# Patient Record
Sex: Male | Born: 1950 | Race: Black or African American | Hispanic: No | Marital: Married | Smoking: Current every day smoker
Health system: Southern US, Community
[De-identification: ages and names within clinical notes are randomized; demographics above are authoritative.]

## PROBLEM LIST (undated history)

## (undated) DIAGNOSIS — M1710 Unilateral primary osteoarthritis, unspecified knee: Secondary | ICD-10-CM

## (undated) DIAGNOSIS — M179 Osteoarthritis of knee, unspecified: Secondary | ICD-10-CM

## (undated) DIAGNOSIS — I10 Essential (primary) hypertension: Secondary | ICD-10-CM

## (undated) DIAGNOSIS — S46009A Unspecified injury of muscle(s) and tendon(s) of the rotator cuff of unspecified shoulder, initial encounter: Secondary | ICD-10-CM

## (undated) DIAGNOSIS — C801 Malignant (primary) neoplasm, unspecified: Secondary | ICD-10-CM

## (undated) DIAGNOSIS — B1920 Unspecified viral hepatitis C without hepatic coma: Secondary | ICD-10-CM

## (undated) HISTORY — PX: SCAPHOID FRACTURE SURGERY: SHX769

## (undated) HISTORY — PX: APPENDECTOMY: SHX54

---

## 1998-02-10 ENCOUNTER — Encounter: Payer: Self-pay | Admitting: Emergency Medicine

## 1998-02-10 ENCOUNTER — Emergency Department (HOSPITAL_COMMUNITY): Admission: EM | Admit: 1998-02-10 | Discharge: 1998-02-10 | Payer: Self-pay | Admitting: Emergency Medicine

## 1999-12-12 ENCOUNTER — Encounter: Payer: Self-pay | Admitting: *Deleted

## 1999-12-12 ENCOUNTER — Emergency Department (HOSPITAL_COMMUNITY): Admission: EM | Admit: 1999-12-12 | Discharge: 1999-12-12 | Payer: Self-pay | Admitting: *Deleted

## 2001-11-15 ENCOUNTER — Emergency Department (HOSPITAL_COMMUNITY): Admission: EM | Admit: 2001-11-15 | Discharge: 2001-11-15 | Payer: Self-pay | Admitting: Emergency Medicine

## 2001-11-15 ENCOUNTER — Encounter: Payer: Self-pay | Admitting: Emergency Medicine

## 2001-11-20 ENCOUNTER — Encounter: Admission: RE | Admit: 2001-11-20 | Discharge: 2001-11-20 | Payer: Self-pay | Admitting: Internal Medicine

## 2001-12-12 ENCOUNTER — Emergency Department (HOSPITAL_COMMUNITY): Admission: EM | Admit: 2001-12-12 | Discharge: 2001-12-12 | Payer: Self-pay | Admitting: Emergency Medicine

## 2003-01-06 ENCOUNTER — Encounter: Payer: Self-pay | Admitting: General Practice

## 2003-01-06 ENCOUNTER — Encounter: Admission: RE | Admit: 2003-01-06 | Discharge: 2003-01-06 | Payer: Self-pay | Admitting: General Practice

## 2003-01-06 ENCOUNTER — Inpatient Hospital Stay (HOSPITAL_COMMUNITY): Admission: EM | Admit: 2003-01-06 | Discharge: 2003-01-08 | Payer: Self-pay | Admitting: Emergency Medicine

## 2003-01-16 ENCOUNTER — Emergency Department (HOSPITAL_COMMUNITY): Admission: EM | Admit: 2003-01-16 | Discharge: 2003-01-16 | Payer: Self-pay | Admitting: Emergency Medicine

## 2003-01-16 ENCOUNTER — Encounter: Payer: Self-pay | Admitting: Emergency Medicine

## 2004-08-21 ENCOUNTER — Emergency Department (HOSPITAL_COMMUNITY): Admission: EM | Admit: 2004-08-21 | Discharge: 2004-08-21 | Payer: Self-pay | Admitting: Emergency Medicine

## 2004-11-16 ENCOUNTER — Encounter: Admission: RE | Admit: 2004-11-16 | Discharge: 2004-11-16 | Payer: Self-pay | Admitting: Otolaryngology

## 2004-11-20 ENCOUNTER — Ambulatory Visit (HOSPITAL_COMMUNITY): Admission: RE | Admit: 2004-11-20 | Discharge: 2004-11-20 | Payer: Self-pay | Admitting: Otolaryngology

## 2004-11-20 ENCOUNTER — Encounter (INDEPENDENT_AMBULATORY_CARE_PROVIDER_SITE_OTHER): Payer: Self-pay | Admitting: *Deleted

## 2004-11-20 ENCOUNTER — Ambulatory Visit (HOSPITAL_BASED_OUTPATIENT_CLINIC_OR_DEPARTMENT_OTHER): Admission: RE | Admit: 2004-11-20 | Discharge: 2004-11-20 | Payer: Self-pay | Admitting: Otolaryngology

## 2006-01-02 ENCOUNTER — Emergency Department (HOSPITAL_COMMUNITY): Admission: EM | Admit: 2006-01-02 | Discharge: 2006-01-02 | Payer: Self-pay | Admitting: Emergency Medicine

## 2007-08-02 ENCOUNTER — Emergency Department (HOSPITAL_COMMUNITY): Admission: EM | Admit: 2007-08-02 | Discharge: 2007-08-02 | Payer: Self-pay | Admitting: Family Medicine

## 2007-11-24 ENCOUNTER — Encounter: Admission: RE | Admit: 2007-11-24 | Discharge: 2008-01-07 | Payer: Self-pay | Admitting: Orthopedic Surgery

## 2009-01-24 ENCOUNTER — Emergency Department (HOSPITAL_COMMUNITY): Admission: EM | Admit: 2009-01-24 | Discharge: 2009-01-24 | Payer: Self-pay | Admitting: Emergency Medicine

## 2010-04-29 ENCOUNTER — Emergency Department (HOSPITAL_COMMUNITY)
Admission: EM | Admit: 2010-04-29 | Discharge: 2010-04-29 | Payer: Self-pay | Source: Home / Self Care | Admitting: Emergency Medicine

## 2010-08-10 ENCOUNTER — Emergency Department (HOSPITAL_COMMUNITY): Payer: Non-veteran care

## 2010-08-10 ENCOUNTER — Inpatient Hospital Stay (HOSPITAL_COMMUNITY)
Admission: EM | Admit: 2010-08-10 | Discharge: 2010-08-14 | DRG: 264 | Disposition: A | Discharge status: Home / Self Care | Payer: Non-veteran care | Attending: Internal Medicine | Admitting: Internal Medicine

## 2010-08-10 ENCOUNTER — Emergency Department (HOSPITAL_COMMUNITY)
Admission: EM | Admit: 2010-08-10 | Discharge: 2010-08-10 | Disposition: A | Discharge status: Home / Self Care | Payer: PRIVATE HEALTH INSURANCE | Attending: Emergency Medicine | Admitting: Emergency Medicine

## 2010-08-10 ENCOUNTER — Emergency Department (HOSPITAL_COMMUNITY): Payer: PRIVATE HEALTH INSURANCE

## 2010-08-10 DIAGNOSIS — W19XXXA Unspecified fall, initial encounter: Secondary | ICD-10-CM | POA: Insufficient documentation

## 2010-08-10 DIAGNOSIS — R55 Syncope and collapse: Principal | ICD-10-CM | POA: Diagnosis present

## 2010-08-10 DIAGNOSIS — Z8619 Personal history of other infectious and parasitic diseases: Secondary | ICD-10-CM | POA: Insufficient documentation

## 2010-08-10 DIAGNOSIS — Z79899 Other long term (current) drug therapy: Secondary | ICD-10-CM | POA: Insufficient documentation

## 2010-08-10 DIAGNOSIS — M19239 Secondary osteoarthritis, unspecified wrist: Secondary | ICD-10-CM | POA: Diagnosis present

## 2010-08-10 DIAGNOSIS — E876 Hypokalemia: Secondary | ICD-10-CM | POA: Diagnosis present

## 2010-08-10 DIAGNOSIS — I5032 Chronic diastolic (congestive) heart failure: Secondary | ICD-10-CM | POA: Diagnosis present

## 2010-08-10 DIAGNOSIS — S6990XA Unspecified injury of unspecified wrist, hand and finger(s), initial encounter: Secondary | ICD-10-CM | POA: Insufficient documentation

## 2010-08-10 DIAGNOSIS — B1920 Unspecified viral hepatitis C without hepatic coma: Secondary | ICD-10-CM | POA: Diagnosis present

## 2010-08-10 DIAGNOSIS — S59909A Unspecified injury of unspecified elbow, initial encounter: Secondary | ICD-10-CM | POA: Insufficient documentation

## 2010-08-10 DIAGNOSIS — I10 Essential (primary) hypertension: Secondary | ICD-10-CM | POA: Diagnosis present

## 2010-08-10 DIAGNOSIS — Y9289 Other specified places as the place of occurrence of the external cause: Secondary | ICD-10-CM

## 2010-08-10 DIAGNOSIS — IMO0002 Reserved for concepts with insufficient information to code with codable children: Secondary | ICD-10-CM | POA: Diagnosis present

## 2010-08-10 DIAGNOSIS — S63509A Unspecified sprain of unspecified wrist, initial encounter: Secondary | ICD-10-CM | POA: Insufficient documentation

## 2010-08-10 DIAGNOSIS — S61209A Unspecified open wound of unspecified finger without damage to nail, initial encounter: Secondary | ICD-10-CM | POA: Diagnosis present

## 2010-08-10 DIAGNOSIS — T40605A Adverse effect of unspecified narcotics, initial encounter: Secondary | ICD-10-CM | POA: Diagnosis present

## 2010-08-10 DIAGNOSIS — S02401A Maxillary fracture, unspecified, initial encounter for closed fracture: Secondary | ICD-10-CM | POA: Diagnosis present

## 2010-08-10 DIAGNOSIS — F17200 Nicotine dependence, unspecified, uncomplicated: Secondary | ICD-10-CM | POA: Diagnosis present

## 2010-08-10 DIAGNOSIS — Y92009 Unspecified place in unspecified non-institutional (private) residence as the place of occurrence of the external cause: Secondary | ICD-10-CM | POA: Insufficient documentation

## 2010-08-10 LAB — URINALYSIS, ROUTINE W REFLEX MICROSCOPIC
Bilirubin Urine: NEGATIVE
Glucose, UA: NEGATIVE mg/dL
Hgb urine dipstick: NEGATIVE
Ketones, ur: NEGATIVE mg/dL
Nitrite: NEGATIVE
Specific Gravity, Urine: 1.017 (ref 1.005–1.030)
pH: 6.5 (ref 5.0–8.0)

## 2010-08-10 LAB — POCT CARDIAC MARKERS
CKMB, poc: <1 ng/mL — ABNORMAL LOW (ref 1.0–8.0)
Myoglobin, poc: 112 ng/mL (ref 12–200)
Troponin i, poc: <0.05 ng/mL (ref 0.00–0.09)

## 2010-08-10 LAB — BASIC METABOLIC PANEL
BUN: 9 mg/dL (ref 6–23)
CO2: 26 mEq/L (ref 19–32)
GFR calc non Af Amer: >60 mL/min (ref >60)
Glucose, Bld: 116 mg/dL — ABNORMAL HIGH (ref 70–99)
Potassium: 3.4 mEq/L — ABNORMAL LOW (ref 3.5–5.1)
Sodium: 137 mEq/L (ref 135–145)

## 2010-08-10 LAB — RAPID URINE DRUG SCREEN, HOSP PERFORMED
Barbiturates: NOT DETECTED
Cocaine: NOT DETECTED
Opiates: NOT DETECTED
Tetrahydrocannabinol: NOT DETECTED

## 2010-08-10 LAB — CBC
HCT: 32.7 % — ABNORMAL LOW (ref 39.0–52.0)
Hemoglobin: 10.5 g/dL — ABNORMAL LOW (ref 13.0–17.0)
MCV: 93.7 fL (ref 78.0–100.0)
RDW: 16.9 % — ABNORMAL HIGH (ref 11.5–15.5)
WBC: 3.3 10*3/uL — ABNORMAL LOW (ref 4.0–10.5)

## 2010-08-10 LAB — DIFFERENTIAL
Eosinophils Relative: 1 % (ref 0–5)
Lymphocytes Relative: 41 % (ref 12–46)
Lymphs Abs: 1.4 10*3/uL (ref 0.7–4.0)
Monocytes Absolute: 0.4 10*3/uL (ref 0.1–1.0)
Neutro Abs: 1.5 10*3/uL — ABNORMAL LOW (ref 1.7–7.7)

## 2010-08-10 LAB — GLUCOSE, CAPILLARY

## 2010-08-11 ENCOUNTER — Inpatient Hospital Stay (HOSPITAL_COMMUNITY): Payer: Non-veteran care

## 2010-08-11 LAB — COMPREHENSIVE METABOLIC PANEL
ALT: 19 U/L (ref 0–53)
Alkaline Phosphatase: 58 U/L (ref 39–117)
BUN: 9 mg/dL (ref 6–23)
CO2: 26 mEq/L (ref 19–32)
Calcium: 8.7 mg/dL (ref 8.4–10.5)
GFR calc non Af Amer: >60 mL/min (ref >60)
Glucose, Bld: 96 mg/dL (ref 70–99)
Sodium: 139 mEq/L (ref 135–145)
Total Protein: 6.8 g/dL (ref 6.0–8.3)

## 2010-08-11 LAB — CBC
HCT: 29.5 % — ABNORMAL LOW (ref 39.0–52.0)
Hemoglobin: 9.5 g/dL — ABNORMAL LOW (ref 13.0–17.0)
MCH: 29.9 pg (ref 26.0–34.0)
MCHC: 32.2 g/dL (ref 30.0–36.0)
MCV: 92.8 fL (ref 78.0–100.0)
RDW: 16.4 % — ABNORMAL HIGH (ref 11.5–15.5)

## 2010-08-11 LAB — GLUCOSE, CAPILLARY: Glucose-Capillary: 101 mg/dL — ABNORMAL HIGH (ref 70–99)

## 2010-08-11 LAB — FOLATE: Folate: 11.6 ng/mL

## 2010-08-11 LAB — MAGNESIUM: Magnesium: 1.9 mg/dL (ref 1.5–2.5)

## 2010-08-11 LAB — FERRITIN: Ferritin: 708 ng/mL — ABNORMAL HIGH (ref 22–322)

## 2010-08-11 LAB — IRON AND TIBC: UIBC: 256 ug/dL

## 2010-08-11 LAB — TSH: TSH: 1.827 u[IU]/mL (ref 0.350–4.500)

## 2010-08-12 LAB — BASIC METABOLIC PANEL
Chloride: 104 mEq/L (ref 96–112)
Creatinine, Ser: 1.02 mg/dL (ref 0.4–1.5)
GFR calc Af Amer: >60 mL/min (ref >60)
Potassium: 3.5 mEq/L (ref 3.5–5.1)

## 2010-08-12 LAB — CARDIAC PANEL(CRET KIN+CKTOT+MB+TROPI)
Relative Index: 1.3 (ref 0.0–2.5)
Total CK: 223 U/L (ref 7–232)
Troponin I: 0.03 ng/mL (ref 0.00–0.06)
Troponin I: 0.03 ng/mL (ref 0.00–0.06)

## 2010-08-12 LAB — HEMOCCULT GUIAC POC 1CARD (OFFICE): Fecal Occult Bld: NEGATIVE

## 2010-08-12 NOTE — Consult Note (Signed)
NAME:  Antonio Nicholson, Antonio Nicholson NO.:  [id number]  MEDICAL RECORD NO.:  [id number]           PATIENT TYPE:  I  LOCATION:  3702                         FACILITY:  [facility]  PHYSICIAN:  Dionne Ano. Kahlen Morais, M.D.DATE OF BIRTH:  12-13-1950  DATE OF CONSULTATION:  08/10/2010 DATE OF DISCHARGE:                                CONSULTATION   I had the pleasure to see Antonio Nicholson in the [facility].  He was admitted today after a fall.  He fell and sustained facial trauma.  In the course of his workup, it was noted that the patient had significant pain in his left wrist.  He states his left wrist has been painful for quite some time.  He has had a longstanding history of problems.  He has had surgery by Dr. Teressa Senter for middle finger injuries and wrist injury.  The patient notes that he has had scaphoid excision and attempted four-corner fusion it appears.  The wrist is tender and painful.  He notes no locking, popping, catching.  Denies elbow pain.  Right upper extremity is notable for an IV access but otherwise is nontender.  Pain was not moderate in the wrist.  He had facial trauma and surgery is being planned for the facial trauma it appears in the future.  I have been asked to see him by his hospitalist in regard to the upper extremity predicament.  Past medical and surgical history are reviewed.  The patient and I have discussed all issues in regard to his chief complaints and reason for admission.  His daughter is here with him as his is granddaughter who both correlate and discussed his exam and history.  His examination shows a black male, alert and oriented, in no distress. Vital signs stable.  The patient has normal lower extremity exam.  Face is deferred to the facial specialist.  His chest is clear.  Abdomen is mildly protuberant, nontender, nondistended and soft.  Right upper extremity has IV access, nontender, nondistended.  Neurovascularly intact.   Left upper extremity has swelling and mild synovitis as sequelae of severe arthritis in the wrist.  His middle finger has loss of motion at the DIP joint.  There is no evidence of infection or dystrophy.  Thumb and ring finger have lacerations about them which I have reviewed.  These lacerations are full-thickness skin lacerations and there is devitalized tissue in the region.  I have reviewed this at length and his findings.  His x-rays are reviewed.  Hand x-rays show degenerative arthritis.  He has sequela of scaphoid excision and four-corner fusion attempts.  It appears the four-corner fusion has not gone on to completely quiescent osseous integration and that he has severe degenerative changes throughout the wrist.  Middle finger also shows degenerative changes about the DIP joint.  I have reviewed this at length and his findings. Ring finger and thumb are without obvious acute trauma or dislocation features.  IMPRESSION: 1. 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 Teressa Senter at     the [facility]. 2.  Recent injury to the thumb and ring finger with skin avulsion and     defect. 3. History of middle finger reconstruction with degenerative     arthritis. 4. No evidence of compartment syndrome, infection, dystrophy, or acute     fracture.  This is primarily a sequelae of arthritis.  PLAN:  I have gone ahead and consented him verbally for I and D.  He underwent I and D of skin and subcutaneous tissue about thumb and skin and subcutaneous tissue about the ring finger with removal (excisional debridement) of the devitalized tissue.  I used knife, blade, scissor, and orthopedic instruments for this.  Following this I dressed him sterilely.  I feel the nursing staff can dress him with Neosporin, nonstick gauze, and daily dressings.  I have discussed this with he and his family at length and they agree.   I have discussed with him that in regard to the wrist arthritis, I would recommend referral back to his original surgeon Dr. Teressa Senter to discuss further reconstructive options if he desires.  Otherwise, he simply has arthritis and an acute exacerbation.  I put this in the chart.  I will plan to see him back if his symptoms dictate.  These notes discussed all questions have been encouraged and answered.  It was pleasure to see him tonight.     Dionne Ano. Amanda Pea, M.D.     [facility]  D:  08/10/2010  T:  08/11/2010  Job:  [id number]  Electronically Signed by Dominica Severin M.D. on 08/12/2010 07:52:35 AM

## 2010-08-13 ENCOUNTER — Inpatient Hospital Stay (HOSPITAL_COMMUNITY): Payer: Non-veteran care

## 2010-08-13 LAB — BASIC METABOLIC PANEL
BUN: 6 mg/dL (ref 6–23)
Chloride: 106 mEq/L (ref 96–112)
Creatinine, Ser: 0.91 mg/dL (ref 0.4–1.5)
Glucose, Bld: 84 mg/dL (ref 70–99)

## 2010-08-13 NOTE — Op Note (Signed)
NAME:  Antonio Nicholson, Antonio Nicholson NO.:  [id number]  MEDICAL RECORD NO.:  [id number]           PATIENT TYPE:  I  LOCATION:  3702                         FACILITY:  [facility]  PHYSICIAN:  Lincoln Brigham, DDSDATE OF BIRTH:  July 19, 1950  DATE OF PROCEDURE:  08/11/2010 DATE OF DISCHARGE:                              OPERATIVE REPORT   PREOPERATIVE DIAGNOSIS:  Right zygomaticomaxillary complex fracture.  POSTOPERATIVE DIAGNOSIS:  Right zygomaticomaxillary complex fracture.  NAME OF OPERATION:  Open reduction and internal fixation of right zygomaticomaxillary complex fracture.  SURGEON:  Lincoln Brigham, DDS  INDICATIONS FOR SURGERY:  The patient is a 60 year old African American male status post ground-level fall on August 10, 2010, after being discharged from the [facility] ER for her previous ground-level fall that same day.  The patient was waiting at bus stop and sustained a syncopal event.  At that time, the patient lost consciousness and fell outside on the ground at the bus stop sustaining a right zygomaticomaxillary complex fracture, which was diagnosed on a maxillofacial head CT scan.  It was deemed necessary that the patient be taken to the operating room for repair of the depressed zygomaticomaxillary complex fracture.  PROCEDURE IN DETAIL:  The patient was seen and evaluated by Anesthesia. The patient was deemed appropriate for surgery.  The patient's history and physical and consent were verified and updated by myself.  All the patient's questions were answered.  The patient was taken to the operating room by Anesthesia and placed under general anesthesia.  The patient was positioned on the table in a supine position.  After the patient was intubated, he was turned 90 degrees from anesthesia.  The patient was prepped and draped in usual sterile fashion all maxillofacial surgery procedures.  Approximately 10 mL of local anesthetic which was 2%  lidocaine with 1:1 epinephrine was used to anesthetize the right lateral brow region and then intraorally and the right maxillary tuberosity region also anteriorly towards the piriform rim on the patient's right side.  At this point, a moistened Ray-Tec was then placed in the patient's mouth vessel as a throat pack.  At this point, a 15-blade was then used to make a skin incision just lateral to the patient's brow from the patient's right side.  This incision was carried down through skin and subcutaneous tissue.  Next, the Bovie cautery set at 30:30 was then used to carry the incision down to bone and next the periosteal elevator was then used to create a pocket and identify the fracture of the zygomaticofrontal suture.  After the fracture was identified, our attention was then turned intraorally where a Bovie cautery set on 30:30 was used to make a hemi Jerry Caras I incision extending from the maxillary buttress to the piriform rim on the right side.  This incision was carried down to bone and a periosteal elevator was then used to reflect tissue and identify the facial fractures.  At this point, a periosteal elevator was then to mobilize the fractured segment and reduce that segment.  Of note 2 small pieces of the patient's right maxillary anterior sinus were removed from the maxillary  sinus cavity.  These pieces were too small to plate in place as screws being placed in then would cause avascular necrosis of the bony segments.  These pieces were discarded.  After the fracture was properly reduced, a Stryker Leibinger 64-L plate was then positioned at the patient's right maxillary buttress and secured with six 5-mm screws. This plate was taken from the mid Administrator.  Next our attention was then turned to the patient's zygomaticofrontal suture where am upper face Stryker Leibinger set plate was then used to internally fixate the frontal zygomaticofrontal suture.  Two 4-mm  screws were then used along with two 5-mm screws to fixate a plate.  At this point, copious irrigation was then used to irrigate both wounds and then the lateral brow wound was closed with 4-0 Vicryl suture deep and then a running 5-0 Prolene was then used to close the skin superficially. Intraorally a 3-0 chromic gut suture was then used to close the incision.  The patient had good hemostasis at the conclusion of the case.  All counts were correct x2.  The patient's moistened Ray-Tec was removed and the patient's mouth was suctioned with Yankauer suction.  An OG tube was then passed with minimal output and an OG tube was then removed.  The patient was then turned over to the care of Anesthesia once again.  The patient was extubated and taken to the recovery area in a stable fashion.  COMPLICATIONS:  None.  ESTIMATED BLOOD LOSS:  Minimal.  FINDINGS:  Fracture of the right zygomaticomaxillary complex.  No specimens were sent to Pathology; however, there were two pieces of the right maxillary sinus, which were discarded, which could not be plated.          ______________________________ Lincoln Brigham, DDS     CD/MEDQ  D:  08/12/2010  T:  08/13/2010  Job:  [id number]  Electronically Signed by Lincoln Brigham DDS on 08/13/2010 07:43:29 AM

## 2010-08-14 LAB — RAPID URINE DRUG SCREEN, HOSP PERFORMED
Amphetamines: NOT DETECTED
Benzodiazepines: NOT DETECTED
Cocaine: NOT DETECTED
Tetrahydrocannabinol: NOT DETECTED

## 2010-08-14 LAB — HEMOCCULT GUIAC POC 1CARD (OFFICE): Fecal Occult Bld: POSITIVE

## 2010-08-14 LAB — CLOSTRIDIUM DIFFICILE BY PCR: Toxigenic C. Difficile by PCR: NEGATIVE

## 2010-08-14 NOTE — Procedures (Signed)
EEG NUMBER:  HISTORY:  A 60 year old male with syncope.  MEDICATIONS:  Boceprevir, ribavirin, potassium, Rocephin, oxycodone, Dilaudid.  CONDITIONS OF RECORDING:  This is a 16-channel EEG carried out with the patient in the awake, drowsy, and asleep states.  DESCRIPTION:  The waking background activity consists of a low-voltage symmetrical fairly well-organized 10 Hz alpha activity seen from the parieto-occipital and posterotemporal regions.  Low-voltage fast activity poorly organized are seen anteriorly at times superimposed on more posterior rhythms.  A mixture of theta and alpha rhythm was seen from the central and temporal regions.  The patient drowses with slowing to irregular low-voltage theta and beta activity.  The patient goes into a light sleep with symmetrical sleep spindles.  The vertex was a sharp activity and irregular slow activity.  Hypoventilation was not performed.  Intermittent photic stimulation failed to elicit any change in the tracing.  IMPRESSION:  This is a normal EEG.          ______________________________ Thana Farr, MD    JY:NWGN D:  08/13/2010 14:47:29  T:  08/14/2010 01:54:46  Job #:  [id number]
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
## 2010-08-31 NOTE — Op Note (Signed)
NAME:  DAKIN, MADANI NO.:  [id number]   MEDICAL RECORD NO.:  [id number]          PATIENT TYPE:  AMB   LOCATION:  DSC                          FACILITY:  [facility]   PHYSICIAN:  Christopher E. 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   OPERATION:  Microlaryngoscopy with biopsy of left and right anterior vocal  cord lesions.   SURGEON:  Narda Bonds, M.D.   ANESTHESIA:  General endotracheal.   COMPLICATIONS:  None.   BRIEF CLINICAL NOTE:  Antonio Nicholson is a 60 year old gentleman who has had  previous vocal cord lesions removed from his vocal cords 20 years ago.  Over  the last several months his voice has gradually gotten worse again, he does  smoke about a pack a day and moderate alcohol use.  Denies any sore throat.  On examination, he has what appears to be a large anterior nodule of the  left vocal cord.  He is taken to the operating room at this time for direct  laryngoscopy and biopsy.   DESCRIPTION OF PROCEDURE:  After adequate endotracheal anesthesia, a direct  laryngoscopy was performed.  The base of tongue, vallecula, epiglottis were  normal to evaluation.  Both piriform sinuses were clear.  On evaluation of  vocal cords, Jaxon had a nodular lesion on the left vocal cord and some  leukoplakic changes of the right vocal cord.  The laryngoscope was  suspended, pictures were obtained and biopsies were obtained of the nodule  on the left vocal cord and the leukoplakic area on the right vocal cord.  Anterior commissure appeared unaffected.  Hemostasis was obtained with  cotton pledget soaked in adrenaline.  That completed the procedure.  Darrold  was awoken from anesthesia and  transferred to the recovery room postop doing  well.   DISPOSITION:  Glennie Rodda is discharged home later this morning.  Will  have followup in my office in 1 week for recheck and review pathology.  I  gave him a prescription for Darvocet-N 100 1-2 q.4 hours p.r.n. pain along  with Tylenol.        CEN/MEDQ  D:  11/20/2004  T:  11/20/2004  Job:  [id number]

## 2011-01-08 LAB — COMPREHENSIVE METABOLIC PANEL
ALT: 55 — ABNORMAL HIGH
AST: 62 — ABNORMAL HIGH
Alkaline Phosphatase: 50
CO2: 19
GFR calc Af Amer: >60
GFR calc non Af Amer: >60
Glucose, Bld: 69 — ABNORMAL LOW
Potassium: 4.4
Sodium: 143
Total Protein: 6.9

## 2011-01-08 LAB — DIFFERENTIAL
Basophils Relative: 0
Eosinophils Absolute: 0
Eosinophils Relative: 0
Monocytes Relative: 9
Neutrophils Relative %: 70

## 2011-01-08 LAB — URINALYSIS, ROUTINE W REFLEX MICROSCOPIC
Bilirubin Urine: NEGATIVE
Ketones, ur: 15 — AB
Nitrite: NEGATIVE
Protein, ur: NEGATIVE
Specific Gravity, Urine: 1.017
Urobilinogen, UA: 0.2

## 2011-01-08 LAB — ETHANOL: Alcohol, Ethyl (B): 20 — ABNORMAL HIGH

## 2011-01-08 LAB — CBC
Hemoglobin: 13.3
RBC: 4.22

## 2011-01-08 LAB — RAPID URINE DRUG SCREEN, HOSP PERFORMED
Amphetamines: NOT DETECTED
Tetrahydrocannabinol: POSITIVE — AB

## 2011-01-15 ENCOUNTER — Inpatient Hospital Stay (HOSPITAL_COMMUNITY)
Admission: EM | Admit: 2011-01-15 | Discharge: 2011-01-19 | DRG: 554 | Disposition: A | Discharge status: Home / Self Care | Payer: Non-veteran care | Attending: Internal Medicine | Admitting: Internal Medicine

## 2011-01-15 DIAGNOSIS — Z531 Procedure and treatment not carried out because of patient's decision for reasons of belief and group pressure: Secondary | ICD-10-CM

## 2011-01-15 DIAGNOSIS — E876 Hypokalemia: Secondary | ICD-10-CM | POA: Diagnosis not present

## 2011-01-15 DIAGNOSIS — I10 Essential (primary) hypertension: Secondary | ICD-10-CM | POA: Diagnosis present

## 2011-01-15 DIAGNOSIS — F17200 Nicotine dependence, unspecified, uncomplicated: Secondary | ICD-10-CM | POA: Diagnosis present

## 2011-01-15 DIAGNOSIS — M2550 Pain in unspecified joint: Secondary | ICD-10-CM | POA: Diagnosis present

## 2011-01-15 DIAGNOSIS — B1920 Unspecified viral hepatitis C without hepatic coma: Secondary | ICD-10-CM | POA: Diagnosis present

## 2011-01-15 DIAGNOSIS — M109 Gout, unspecified: Principal | ICD-10-CM | POA: Diagnosis present

## 2011-01-15 LAB — HEPATIC FUNCTION PANEL
Albumin: 3.2 g/dL — ABNORMAL LOW (ref 3.5–5.2)
Alkaline Phosphatase: 76 U/L (ref 39–117)
Indirect Bilirubin: 0.1 mg/dL — ABNORMAL LOW (ref 0.3–0.9)
Total Bilirubin: 0.2 mg/dL — ABNORMAL LOW (ref 0.3–1.2)

## 2011-01-15 LAB — DIFFERENTIAL
Eosinophils Absolute: 0 10*3/uL (ref 0.0–0.7)
Eosinophils Relative: 0 % (ref 0–5)
Monocytes Absolute: 0.8 10*3/uL (ref 0.1–1.0)
Neutrophils Relative %: 66 % (ref 43–77)

## 2011-01-15 LAB — CBC
MCHC: 30.2 g/dL (ref 30.0–36.0)
RDW: 16.5 % — ABNORMAL HIGH (ref 11.5–15.5)

## 2011-01-15 LAB — BASIC METABOLIC PANEL
Calcium: 9.5 mg/dL (ref 8.4–10.5)
GFR calc Af Amer: >90 mL/min (ref >90)
GFR calc non Af Amer: >90 mL/min (ref >90)
Glucose, Bld: 87 mg/dL (ref 70–99)
Potassium: 2.9 mEq/L — ABNORMAL LOW (ref 3.5–5.1)
Sodium: 137 mEq/L (ref 135–145)

## 2011-01-15 LAB — SEDIMENTATION RATE: Sed Rate: 103 mm/hr — ABNORMAL HIGH (ref 0–16)

## 2011-01-16 ENCOUNTER — Inpatient Hospital Stay (HOSPITAL_COMMUNITY): Payer: Non-veteran care

## 2011-01-16 LAB — RENAL FUNCTION PANEL
Albumin: 2.7 g/dL — ABNORMAL LOW (ref 3.5–5.2)
Chloride: 103 mEq/L (ref 96–112)
Creatinine, Ser: 0.66 mg/dL (ref 0.50–1.35)
GFR calc non Af Amer: >90 mL/min (ref >90)
Potassium: 2.9 mEq/L — ABNORMAL LOW (ref 3.5–5.1)

## 2011-01-16 LAB — URIC ACID: Uric Acid, Serum: 6.7 mg/dL (ref 4.0–7.8)

## 2011-01-16 LAB — C4 COMPLEMENT: Complement C4, Body Fluid: 36 mg/dL (ref 10–40)

## 2011-01-16 LAB — CBC
Platelets: 165 10*3/uL (ref 150–400)
RDW: 16.6 % — ABNORMAL HIGH (ref 11.5–15.5)
WBC: 5.8 10*3/uL (ref 4.0–10.5)

## 2011-01-17 LAB — COMPREHENSIVE METABOLIC PANEL
ALT: 11 U/L (ref 0–53)
BUN: 6 mg/dL (ref 6–23)
Calcium: 8.6 mg/dL (ref 8.4–10.5)
Creatinine, Ser: 0.6 mg/dL (ref 0.50–1.35)
GFR calc Af Amer: >90 mL/min (ref >90)
Glucose, Bld: 116 mg/dL — ABNORMAL HIGH (ref 70–99)
Sodium: 137 mEq/L (ref 135–145)
Total Protein: 6.3 g/dL (ref 6.0–8.3)

## 2011-01-17 LAB — DIFFERENTIAL
Basophils Relative: 0 % (ref 0–1)
Monocytes Absolute: 1.2 10*3/uL — ABNORMAL HIGH (ref 0.1–1.0)
Monocytes Relative: 19 % — ABNORMAL HIGH (ref 3–12)
Neutro Abs: 3.7 10*3/uL (ref 1.7–7.7)

## 2011-01-17 LAB — SYNOVIAL CELL COUNT + DIFF, W/ CRYSTALS
Eosinophils-Synovial: 0 % (ref 0–1)
Other Cells-SYN: 0

## 2011-01-17 LAB — CBC
Hemoglobin: 7.7 g/dL — ABNORMAL LOW (ref 13.0–17.0)
MCH: 27.4 pg (ref 26.0–34.0)
MCHC: 30.3 g/dL (ref 30.0–36.0)
MCV: 90.4 fL (ref 78.0–100.0)

## 2011-01-17 LAB — CYCLIC CITRUL PEPTIDE ANTIBODY, IGG: Cyclic Citrullin Peptide Ab: <2 U/mL (ref 0.0–5.0)

## 2011-01-17 LAB — MAGNESIUM: Magnesium: 1.8 mg/dL (ref 1.5–2.5)

## 2011-01-17 LAB — ABO/RH: ABO/RH(D): O POS

## 2011-01-17 LAB — ANA: Anti Nuclear Antibody(ANA): NEGATIVE

## 2011-01-18 LAB — COMPREHENSIVE METABOLIC PANEL
AST: 29 U/L (ref 0–37)
Albumin: 2.5 g/dL — ABNORMAL LOW (ref 3.5–5.2)
Alkaline Phosphatase: 63 U/L (ref 39–117)
CO2: 23 mEq/L (ref 19–32)
Chloride: 104 mEq/L (ref 96–112)
Creatinine, Ser: 0.55 mg/dL (ref 0.50–1.35)
GFR calc non Af Amer: >90 mL/min (ref >90)
Potassium: 3.5 mEq/L (ref 3.5–5.1)
Total Bilirubin: 0.3 mg/dL (ref 0.3–1.2)

## 2011-01-18 LAB — DIFFERENTIAL
Basophils Absolute: 0 10*3/uL (ref 0.0–0.1)
Lymphocytes Relative: 22 % (ref 12–46)
Lymphs Abs: 1.3 10*3/uL (ref 0.7–4.0)
Neutrophils Relative %: 63 % (ref 43–77)

## 2011-01-18 LAB — CBC
HCT: 26 % — ABNORMAL LOW (ref 39.0–52.0)
MCV: 89 fL (ref 78.0–100.0)
Platelets: 158 10*3/uL (ref 150–400)
RBC: 2.92 MIL/uL — ABNORMAL LOW (ref 4.22–5.81)
WBC: 6.1 10*3/uL (ref 4.0–10.5)

## 2011-01-19 LAB — COMPREHENSIVE METABOLIC PANEL
ALT: 22 U/L (ref 0–53)
Alkaline Phosphatase: 65 U/L (ref 39–117)
Calcium: 9.2 mg/dL (ref 8.4–10.5)
Chloride: 104 mEq/L (ref 96–112)
Creatinine, Ser: 0.55 mg/dL (ref 0.50–1.35)
GFR calc Af Amer: >90 mL/min (ref >90)
GFR calc non Af Amer: >90 mL/min (ref >90)
Sodium: 138 mEq/L (ref 135–145)
Total Bilirubin: 0.2 mg/dL — ABNORMAL LOW (ref 0.3–1.2)
Total Protein: 6.7 g/dL (ref 6.0–8.3)

## 2011-01-19 LAB — CBC
MCH: 26.9 pg (ref 26.0–34.0)
MCV: 89.5 fL (ref 78.0–100.0)
Platelets: 183 10*3/uL (ref 150–400)
RDW: 16.8 % — ABNORMAL HIGH (ref 11.5–15.5)
WBC: 4.6 10*3/uL (ref 4.0–10.5)

## 2011-01-19 LAB — DIFFERENTIAL
Basophils Relative: 0 % (ref 0–1)
Eosinophils Absolute: 0.1 10*3/uL (ref 0.0–0.7)
Eosinophils Relative: 2 % (ref 0–5)
Lymphs Abs: 1.5 10*3/uL (ref 0.7–4.0)

## 2011-01-19 LAB — MAGNESIUM: Magnesium: 1.7 mg/dL (ref 1.5–2.5)

## 2011-01-19 NOTE — Discharge Summary (Signed)
NAME:  Antonio Nicholson, GATLEY NO.:  [id number]  MEDICAL RECORD NO.:  [id number]  LOCATION:  1338                         FACILITY:  [facility]  PHYSICIAN:  Talmage Nap, MD  DATE OF BIRTH:  [birth year]  DATE OF ADMISSION:  01/15/2011 DATE OF DISCHARGE:  01/19/2011                        DISCHARGE SUMMARY - REFERRING   PRIMARY CARE PHYSICIAN:  [facility].  DISCHARGE DIAGNOSES: 1. Acute gouty arthritis right knee status post arthrocentesis done by     me. 2. Anemia.  The patient declined blood transfusion for religious     reason - Jehovah witness. 3. Hypertension. 4. Electrolyte imbalance - magnesium and potassium level normal on     discharge. 5. Hep C positive.  HISTORY:  The patient is a 60 year old African American male with history of gout and hep C positive, was admitted to the hospital on January 15, 2011 by Dr. Marguerite Olea.  The pain in the lower leg, especially in the knee and the ankles.  Pain was described as achy.  He was also said to have complained about pain in his wrist joint.  The patient denied any history of trauma.  He denied any systemic symptoms i.e. no fever, no chills, no rigor.  Pain was said to be getting progressively worse, hence he presented to the hospital to be evaluated.  PREADMISSION MEDICATIONS: 1. Indomethacin 25 mg one p.o. b.i.d. with meals. 2. Oxycodone 5 mg one p.o. q.6 p.r.n. 3. Timolol ophthalmic solution 0.5% both eyes 1 drop b.i.d. and     artificial tears both eye 1 drop 4 times a day.  ALLERGIES:  CODEINE.  SOCIAL HISTORY:  Negative for alcohol use, but smokes tobacco.  Family history and review of systems essentially documented in the initial history and physical.  PHYSICAL EXAMINATION:  VITAL SIGNS:  At the time the patient was seen by the admitting physician, blood pressure 149/74, pulse 109, respiratory 18, temperature is 100.4, was said to be alert and oriented, not in  any distress. HEENT:  Pupils were reactive to light and extraocular muscles are intact. NECK:  He has no jugular venous distention.  No carotid bruit.  No lymphadenopathy. CHEST:  Clear to auscultation. HEART:  Sounds are 1 and 2. ABDOMEN:  Soft, nontender.  Liver, spleen tip not palpable.  Bowel sounds are positive. EXTREMITIES:  Showed tenderness in the knees and the ankle, and was also said to be tender at the MCP.  There is no rash. NEUROPSYCHIATRIC:  Unremarkable. SKIN:  Normal turgor.  LAB DATA:  LFT normal.  Basic metabolic panel showed sodium of 137, potassium of 2.9, chloride of 100 with a bicarb of 21, glucose is 87, BUN is 13, creatinine is 0.68.  Complete blood count with differential showed WBC of 5.9, hemoglobin of 8.9, hematocrit of 29.5, MCV of 90.2 with a platelet count of 150, neutrophils 66%, monocyte is 14.  ESR 103, uric acid level is 6.7.  C4 complement level 36, normal.  TSH 0.574, normal.  Rheumatoid factor is 12, normal.  ANA negative.  A-cyclic citrullinated peptide less than 2.0.  Repeat complete blood count differential done on January 17, 2011 showed WBC of 6.1, hemoglobin of 7.7, hematocrit of 25.4,  MCV of 90.4 with a platelet count of 165 and comprehensive metabolic panel showed sodium of 137, potassium of 3.3, chloride of 105 with a bicarb of 22, glucose is 116, BUN is 6, creatinine is 0.60.  LFTs are normal.  Magnesium level is 1.8.  Synovial fluid analysis showed WBC of 4001, neutrophils 85%, lymphocytes 15 - intracellular monosodium urate crystals present.  Body fluid culture, few WBC present.  No organisms seen.  Magnesium level done on January 18, 2011, was 1.7.  Repeat complete blood count with differential done on January 19, 2011, showed WBC of 4.6, hemoglobin of 7.9, hematocrit of 26.3, MCV of 89.5, and platelet count of 183.  Comprehensive metabolic panel showed sodium of 138, potassium of 3.6, chloride of 104 with a bicarb of 26, glucose is  97, BUN is 4, creatinine 0.55, magnesium level is 1.7.  Imaging studies done include x-ray of the right knee and it showed moderate suprapatellar joint effusion.  HOSPITAL COURSE:  The patient was admitted to regular floor.  The patient was started on D5 normal saline with 20 mEq of KCl to go at a rate of 150 cc an hour.  Pain control was done with Toradol 30 mg IV q.8 and he was given also 4 g of potassium chloride.  The patient was seen by me for the very first time in this admission on 01/16/2011 and during this encounter, examination showed tenderness in the right knee with suprapatellar fullness in bilateral knees was positive.  At this point, Toradol was discontinued.  The patient was given Motrin 600 mg p.o. t.i.d. with meals and given colchicine 0.6 mg p.o. t.i.d.  He also had an x-ray of the right knee ordered and also given the patient was KCl 10 mEq p.o. b.i.d. and Norvasc 20 mg p.o. daily.  Further pain control was done with IV Dilaudid.  On 01/17/2011, the patient had arthrocentesis done by me under sterile condition and with 14 cc of synovial fluid taken out of the right knee.  Following arthrocentesis, the range of movement was improved on the right knee.  The patient was, however, continued on the colchicine as well as Motrin.  Because the patient's blood count was 7.9 g, he was typed and crossed and to be transfused 1 unit of packed RBCs, but he declined because he was a Jehovah witness. The patient subsequently had been followed by me on a daily basis, made remarkable improvement and swelling in the right knee resolved with improved range of movement.  He was seen by me today which is January 19, 2011, complained of mild pain, right knee; otherwise examination was essentially unremarkable.  Vital Signs:  Blood pressure 120/68, temperature is 98.4, pulse 96, respiratory rate 16.  It is important to mention that the importance of blood transfusion was reiterated to  the patient and he still declined blood transfusion and subsequently ferrous sulfate 325 mg p.o. b.i.d. was added to the patient's regimen.  So far, the patient had remained clinically stable.  Plan is to discharge the patient today on activity as tolerated.  He will be on the following medications: 1. Colchicine 0.6 mg p.o. 4 times a day. 2. Motrin 600 mg p.o. t.i.d. with meals. 3. Ferrous sulfate 325 mg p.o. b.i.d. 4. Percocet 5/325 1-2 tablets p.o. q.4 p.r.n. for pain control. 5. Colace 100 mg p.o. b.i.d. p.r.n. for constipation. 6. Artificial tears 1 drop both eyes 4 times a day. 7. Timolol ophthalmic solution 0.5% in both eyes 1 drop  twice a day. 8. Oxycodone 5 mg 1 p.o. q.6 p.r.n. 9. Norvasc 10mg  p.o daily     Talmage Nap, MD     CN/MEDQ  D:  01/19/2011  T:  01/19/2011  Job:  [id number]  cc:   [facility]  Electronically Signed by Talmage Nap  on 01/19/2011 05:14:59 PM

## 2011-01-21 LAB — BODY FLUID CULTURE: Culture: NO GROWTH

## 2011-01-21 LAB — CROSSMATCH: ABO/RH(D): O POS

## 2011-01-30 NOTE — H&P (Signed)
NAME:  Antonio Nicholson, Antonio Nicholson NO.:  [id number]  MEDICAL RECORD NO.:  [id number]  LOCATION:  WLED                         FACILITY:  [facility]  PHYSICIAN:  Houston Siren, MD           DATE OF BIRTH:  July 05, 1950  DATE OF ADMISSION:  01/15/2011 DATE OF DISCHARGE:                             HISTORY & PHYSICAL   PRIMARY CARE PHYSICIAN:  None.  The patient received care at the [facility].  ADVANCE DIRECTIVE:  Full code.  REASON FOR ADMISSION:  Severe bilateral lower leg pain.  HISTORY OF PRESENT ILLNESS:  This is a 60 year old male with  history of gout; hepatitis C, just finished his interferon treatment; hypertension, presents with acute severe bilateral lower extremity burning pain.  He stated he has pain in both feet as well.  He has severe pain in both of his ankles and knees.  He further has pain at the left MTP joint.  The pain is rather severe with achy and burning in nature, he practically cannot ambulate on it.  He has no fever, chills, nausea, vomiting, or any other systemic symptoms.  He has no rash.  He denied any distant travel.  He denied any alcohol use. Apparently, he has been taking HCTZ for his hypertension.  Evaluation in the emergency room also included a white count of 5.9 thousand, hemoglobin of 8.9, platelet count of 150,000.  Total protein of 7.6, potassium of 2.9, blood sugar of 87, and a sed rate of 100.  Hospitalist was asked to admit the patient for further evaluation and treatment.  PAST MEDICAL HISTORY: 1. Gout. 2. Hepatitis C. 3. Hypertension.  CURRENT MEDICATIONS:  Indomethacin, HCTZ, and oxycodone.  ALLERGIES:  CODEINE.  FAMILY HISTORY:  Noncontributory.  SOCIAL HISTORY:  He does not use any drug or alcohol, but he is a smoker.  REVIEW OF SYSTEMS:  Otherwise unremarkable.  PHYSICAL EXAMINATION:  VITAL SIGNS:  Blood pressure 149/74, pulse of 109, respiratory rate of 18, temperature 100.4. GENERAL:  He is alert  and oriented and is in no apparent distress.  He does have severe pain of his lower extremity. HEENT:  Sclerae are nonicteric.  Pupils equal, round, and reactive. Throat is clear. NECK:  Supple. CARDIAC:  S1, S2 regular.  I did not hear any murmur, rub, or gallop. LUNGS:  Clear. ABDOMEN:  Soft, nondistended, nontender.  I did not feel any organomegaly.  No rebound. EXTREMITIES:  There is bilateral knee and ankle swelling that is exquisitely tender.  He also has a burning pain of the lower extremity. There is an MTP joint swelling and tenderness.  Babinski is of flexion. SKIN:  Warm and dry.  There is no rash.  No nodule or restricted joint movement.  LABORATORY STUDY:  As above.  IMPRESSION:  This is a 60 year old male with hypertension; gout; hepatitis C, just finished interferon, presents with both joint pain, swelling, and neuropathic pain.  Differential would include exacerbation of gout, especially when he is on HCTZ, which likely will increase his uric acid, polyneuropathy multiplex, systemic lupus erythematosus, and rheumatoid arthritis, brought about by interferon.  He is also could have serum complex problem.  We will  treat him with intravenous fluid and Toradol.  I would like to hold off on steroid.  Definitely, we will discontinue his HCTZ.  We will check a uric acid level.  If it does not get a better quickly, we will need to consult Rheumatology as well. Although he has severe pain, he is quite stable.  We will check a urinalysis and complement C4 level as a screening as well.  We will admit him to [facility].     Houston Siren, MD     PL/MEDQ  D:  01/16/2011  T:  01/16/2011  Job:  [id number]  Electronically Signed by Houston Siren  on 01/30/2011 12:15:33 AM

## 2014-01-21 ENCOUNTER — Encounter (HOSPITAL_COMMUNITY): Payer: Self-pay | Admitting: Emergency Medicine

## 2014-01-21 ENCOUNTER — Emergency Department (HOSPITAL_COMMUNITY): Payer: Non-veteran care

## 2014-01-21 ENCOUNTER — Emergency Department (HOSPITAL_COMMUNITY)
Admission: EM | Admit: 2014-01-21 | Discharge: 2014-01-21 | Disposition: A | Discharge status: Home / Self Care | Payer: Non-veteran care | Attending: Emergency Medicine | Admitting: Emergency Medicine

## 2014-01-21 DIAGNOSIS — Y9289 Other specified places as the place of occurrence of the external cause: Secondary | ICD-10-CM | POA: Diagnosis not present

## 2014-01-21 DIAGNOSIS — Z8619 Personal history of other infectious and parasitic diseases: Secondary | ICD-10-CM | POA: Diagnosis not present

## 2014-01-21 DIAGNOSIS — Y9389 Activity, other specified: Secondary | ICD-10-CM | POA: Insufficient documentation

## 2014-01-21 DIAGNOSIS — Z79899 Other long term (current) drug therapy: Secondary | ICD-10-CM | POA: Insufficient documentation

## 2014-01-21 DIAGNOSIS — W01198A Fall on same level from slipping, tripping and stumbling with subsequent striking against other object, initial encounter: Secondary | ICD-10-CM | POA: Insufficient documentation

## 2014-01-21 DIAGNOSIS — Z720 Tobacco use: Secondary | ICD-10-CM | POA: Insufficient documentation

## 2014-01-21 DIAGNOSIS — I10 Essential (primary) hypertension: Secondary | ICD-10-CM | POA: Insufficient documentation

## 2014-01-21 DIAGNOSIS — S6992XA Unspecified injury of left wrist, hand and finger(s), initial encounter: Secondary | ICD-10-CM | POA: Diagnosis present

## 2014-01-21 HISTORY — DX: Essential (primary) hypertension: I10

## 2014-01-21 HISTORY — DX: Unspecified viral hepatitis C without hepatic coma: B19.20

## 2014-01-21 MED ORDER — HYDROCODONE-ACETAMINOPHEN 5-325 MG PO TABS: 1.0000 | ORAL_TABLET | Freq: Four times a day (QID) | ORAL | Status: DC | PRN

## 2014-01-21 NOTE — ED Notes (Signed)
Patient transported to X-ray 

## 2014-01-21 NOTE — ED Notes (Signed)
Pt is also c/o left elbow pain.

## 2014-01-21 NOTE — ED Provider Notes (Signed)
CSN: [id number]     Arrival date & time 01/21/14  1243 History  This chart was scribed for non-physician practitioner, Hyman Bible, PA-C, working with Janice Norrie, MD by Ladene Artist, ED Scribe. This patient was seen in room TR06C/TR06C and the patient's care was started at 2:25 PM.   Chief Complaint  Patient presents with  . Wrist Pain   The history is provided by the patient. No language interpreter was used.   HPI Comments: Antonio Nicholson is a 63 y.o. male who presents to the Emergency Department complaining of constant L wrist pain onset 3 days ago. Pt reports catching himself with his L arm upon falling 3 days ago. He reports associated L wrist swelling and numbness/tingling from L elbow to fingers. Pt reports L scaphoid removal 10 years ago. He has tried Oxycodone without relief. Pt has not applied ice.   Past Medical History  Diagnosis Date  . Hypertension   . Hepatitis C    Past Surgical History  Procedure Laterality Date  . Scaphoid fracture surgery    . Appendectomy     History reviewed. No pertinent family history. History  Substance Use Topics  . Smoking status: Current Every Day Smoker -- 0.30 packs/day    Types: Cigarettes  . Smokeless tobacco: Not on file  . Alcohol Use: Yes     Comment: occasionally    Review of Systems  Musculoskeletal: Positive for arthralgias and joint swelling.  All other systems reviewed and are negative.  Allergies  Codeine  Home Medications   Prior to Admission medications   Medication Sig Start Date End Date Taking? Authorizing Provider  allopurinol (ZYLOPRIM) 300 MG tablet Take 300 mg by mouth daily.   Yes Historical Provider, MD  ferrous fumarate (HEMOCYTE - 106 MG FE) 325 (106 FE) MG TABS tablet Take 1 tablet by mouth daily.   Yes Historical Provider, MD  hydrochlorothiazide (HYDRODIURIL) 50 MG tablet Take 50 mg by mouth daily.   Yes Historical Provider, MD  Multiple Vitamins-Minerals (MULTIVITAMIN WITH MINERALS) tablet  Take 1 tablet by mouth daily.   Yes Historical Provider, MD  PRESCRIPTION MEDICATION Take 1 tablet by mouth 3 (three) times daily. For itching   Yes Historical Provider, MD   Triage Vitals: BP 158/104  Pulse 97  Temp(Src) 97.9 F (36.6 C)  Resp 19  Ht 6' 4.5" (1.943 m)  Wt 200 lb (90.719 kg)  BMI 24.03 kg/m2  SpO2 99% Physical Exam  Nursing note and vitals reviewed. Constitutional: He is oriented to person, place, and time. He appears well-developed and well-nourished. No distress.  HENT:  Head: Normocephalic and atraumatic.  Eyes: Conjunctivae and EOM are normal.  Neck: Neck supple. No tracheal deviation present.  Cardiovascular: Normal rate and regular rhythm.   Pulses:      Radial pulses are 2+ on the left side.  Pulmonary/Chest: Effort normal and breath sounds normal. No respiratory distress.  Musculoskeletal: Normal range of motion.  L wrist: 2+ radial pulses Swelling over dorsal aspect of L wrist and tenderness to palpation  No tenderness to palpation of snuffbox Full ROM of fingers Distal sensation in L fingers intact No swelling over hand L elbow: No tenderness or swelling L shoulder:  Full ROM of L shoulder  Neurological: He is alert and oriented to person, place, and time. No sensory deficit.  Skin: Skin is warm and dry.  Psychiatric: He has a normal mood and affect. His behavior is normal.   ED Course  Procedures (  including critical care time) DIAGNOSTIC STUDIES: Oxygen Saturation is 99% on RA, normal by my interpretation.    COORDINATION OF CARE: 2:32 PM-Discussed treatment plan which includes XRs, medication for pain and follow-up with Dr. Daylene Katayama with pt at bedside and pt agreed to plan.   Labs Review Labs Reviewed - No data to display  Imaging Review Dg Elbow Complete Left  01/21/2014   CLINICAL DATA:  Pain and swelling.  Trauma 3 days prior  EXAM: LEFT ELBOW - COMPLETE 3+ VIEW  COMPARISON:  None.  FINDINGS: Frontal, lateral, and bilateral oblique  views were obtained. There is no fracture, dislocation, or effusion. There is a minimal spur arising from the olecranon. There is no appreciable joint space narrowing. No erosive change.  IMPRESSION: Slight olecranon spurring. No appreciable joint space narrowing. No fracture or dislocation. No erosive change.   Electronically Signed   By: Lowella Grip M.D.   On: 01/21/2014 13:32   Dg Wrist Complete Left  01/21/2014   CLINICAL DATA:  Initial encounter for fall 3 days ago with wrist pain and swelling.  EXAM: LEFT WRIST - COMPLETE 3+ VIEW  COMPARISON:  08/10/2010  FINDINGS: Four views study shows no fracture. No subluxation or dislocation. Scaphoid is surgically absent. No worrisome lytic or sclerotic osseous abnormality.  IMPRESSION: Stable.  No acute bony abnormality.   Electronically Signed   By: Misty Stanley M.D.   On: 01/21/2014 13:32    EKG Interpretation None      MDM   Final diagnoses:  None   Patient presenting with left wrist injury.  No acute findings on Xray.  Neurovascularly intact.  Patient given wrist splint.  Patient stable for discharge.  Patient given referral to Dr Jon Billings with Hand Surgery who had done his previous wrist surgery.  Return precautions given.  I personally performed the services described in this documentation, which was scribed in my presence. The recorded information has been reviewed and is accurate.    Hyman Bible, PA-C 01/21/14 1727

## 2014-01-21 NOTE — ED Notes (Signed)
Pt reports falling on tues and caught himself with L arm; pt with swelling to L wrist

## 2014-01-26 NOTE — ED Provider Notes (Signed)
Medical screening examination/treatment/procedure(s) were performed by non-physician practitioner and as supervising physician I was immediately available for consultation/collaboration.   EKG Interpretation None      Rolland Porter, MD, Abram Sander   Janice Norrie, MD 01/26/14 2300

## 2015-06-07 ENCOUNTER — Telehealth: Payer: Self-pay | Admitting: *Deleted

## 2015-06-07 NOTE — Telephone Encounter (Signed)
  Oncology Nurse Navigator Documentation  Navigator Location: [facility] Onc (06/07/15 [id number]) Navigator Encounter Type: Introductory phone call;Telephone (06/07/15 [id number]) Telephone: Appt Confirmation/Clarification;Outgoing Call (06/07/15 [id number])   Confirmed Diagnosis Date: 05/18/15 (06/07/15 [id number])       Treatment Phase: Pre-Tx/Tx Discussion (06/07/15 [id number])     Placed introductory call to new referral patient.  Introduced myself as the oncology nurse navigator that works with Dr. Isidore Moos to whom he has been referred by Dr. Silvestre Moment.  He confirmed understanding of referral and appt date/time for next Wed, 3/1 at 1:30/2:00.  He agreed to appt reschedule to 9:00/9:30.  I briefly explained my role as a navigator, indicated that I would be joining him during his appt next week.  I confirmed understanding of the [facility] location, explained arrival and RadOnc registration process for appt.  I provided my contact information, encouraged him to call with questions/concerns before next week.  He verbalized understanding of information provided, expressed appreciation for my call.  Gayleen Orem, RN, BSN, [place] at [place] [phone]                             Time Spent with Patient: 15 (06/07/15 0931)

## 2015-06-08 ENCOUNTER — Encounter: Payer: Self-pay | Admitting: Radiation Oncology

## 2015-06-08 NOTE — Progress Notes (Signed)
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   Patient presented with what was initially thought to be a papilloma, and was biopsied and found to be carcinoma.   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   Nutrition Status Yes No Comments  Weight changes? []  [x]    Swallowing concerns? [x]  []  He is eating softer foods  PEG? []  [x]     Referrals Yes No Comments  Social Work? []  [x]    Dentistry? []  [x]    Swallowing therapy? []  [x]    Nutrition? []  [x]    Med/Onc? []  [x]     Safety Issues Yes No Comments  Prior radiation? []  [x]    Pacemaker/ICD? []  [x]    Possible current pregnancy? []  [x]    Is the patient on methotrexate? []  [x]     Tobacco/Marijuana/Snuff/ETOH use: He reports he quit smoking 3 days ago. He is taking Welbutrin twice daily  Past/Anticipated interventions by otolaryngology, if any: Dr. Silvestre Moment, [place]. He saw Dr. Edison Nasuti on 05/31/15, and she recommended: CT Neck and Chest with contrast (It is planned on 06/19/15 at the [facility]) Oncology Consultation- not planned yet.  Radiation Oncology GI Consultation for PEG (which was discussed with the patient) It will be placed tomorrow at the [facility] Smoking Cessation PET scan 06/19/15 at the [facility]  BP 136/100 mmHg  Pulse 81  Temp(Src) 97.8 F (36.6 C)  Wt 209 lb 4.8 oz (94.938 kg)  SpO2 100%   Wt Readings from Last 3 Encounters:  06/14/15 209 lb 4.8 oz (94.938 kg)  01/21/14 200 lb (90.719 kg)       Current Complaints / other details:   No chief complaint on file.

## 2015-06-13 ENCOUNTER — Telehealth: Payer: Self-pay | Admitting: *Deleted

## 2015-06-13 NOTE — Telephone Encounter (Signed)
  Oncology Nurse Navigator Documentation  Navigator Location: [facility] Onc (06/13/15 1622) Navigator Encounter Type: Telephone (06/13/15 1622) Telephone: Antonio Nicholson Confirmation/Clarification (06/13/15 1622)       Called patient to confirm his appt tomorrow morning with Dr. Isidore Moos, answer any questions.  He denied questions, voiced understanding of [place] location, recommended arrival of 40.  Gayleen Orem, RN, BSN, [place] at [place] [phone]                                     Time Spent with Patient: 15 (06/13/15 1622)

## 2015-06-13 NOTE — Progress Notes (Signed)
Radiation Oncology         [phone] ________________________________  Initial outpatient Consultation  Name: Antonio Nicholson MRN: [id number]  Date: 06/14/2015  DOB: [birth year]  [id number] NOT IN SYSTEM  Berneice Heinrich, MD   REFERRING PHYSICIAN: Berneice Heinrich, MD  DIAGNOSIS:    ICD-9-CM ICD-10-CM   1. 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 male with a prior history of ETOH and tobacco abuse. He presented with a 1cm papillomatous soft palate lesion that was initially though to be benign and arising from a papilloma, but at the time of surgery, Dr. Edison Nasuti at the [facility] had concerns it was malignant. He did not undergo any preoperative imaging.   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    PAST SURGICAL HISTORY: Past Surgical History  Procedure Laterality Date  . Scaphoid fracture surgery    . Appendectomy      FAMILY HISTORY: family history is not on file.  SOCIAL HISTORY:  reports that he has been smoking Cigarettes.  He has been smoking about 0.30 packs per day. He does not have any smokeless tobacco history on file. He reports that he drinks alcohol. He reports that he does not use illicit drugs.  ALLERGIES: Codeine  MEDICATIONS:  Current  Outpatient Prescriptions  Medication Sig Dispense Refill  . buPROPion (WELLBUTRIN) 75 MG tablet Take 75 mg by mouth 2 (two) times daily.    . ferrous fumarate (HEMOCYTE - 106 MG FE) 325 (106 FE) MG TABS tablet Take 1 tablet by mouth daily.    . hydrochlorothiazide (HYDRODIURIL) 50 MG tablet Take 50 mg by mouth daily.    Marland Kitchen lidocaine (LMX) 4 % cream Apply 1 application topically 2 (two) times daily as needed (apply small amount to affected area twice a day).    . Multiple Vitamins-Minerals (MULTIVITAMIN WITH MINERALS) tablet Take 1 tablet by mouth daily.    . sildenafil (VIAGRA) 100 MG tablet Take 100 mg by mouth daily as needed for erectile dysfunction (take 1 hour prior to sexual activity, , do not exceed 1 dose per 48 hour period).    . sodium chloride (OCEAN) 0.65 % SOLN nasal spray Place 2 sprays into both nostrils 3 (three) times daily.    . timolol (TIMOPTIC) 0.5 % ophthalmic solution Place 1 drop into both eyes 2 (two) times daily.    Marland Kitchen allopurinol (ZYLOPRIM) 300 MG tablet Take 300 mg by mouth daily. Reported on 06/14/2015    . HYDROcodone-acetaminophen (NORCO/VICODIN) 5-325 MG per tablet Take 1-2 tablets by mouth every 6 (six) hours as needed. (Patient not taking: Reported on 06/14/2015) 15 tablet 0  . PRESCRIPTION MEDICATION Take 1 tablet by mouth 3 (three) times daily. For itching     No current  facility-administered medications for this encounter.    REVIEW OF SYSTEMS:  Notable for that above.   PHYSICAL EXAM:  weight is 209 lb 4.8 oz (94.938 kg). His temperature is 97.8 F (36.6 C). His blood pressure is 136/100 and his pulse is 81. His oxygen saturation is 100%.   General: Alert and oriented, in no acute distress HEENT: Head is normocephalic. Extraocular movements are intact. Oropharynx is notable for subtle granulation tissue over left soft palate. Most teeth missing Neck: Neck is notable for no masses in SCV or cervical regions Heart: Regular in rate and rhythm with no murmurs,  rubs, or gallops. Chest: Clear to auscultation bilaterally, with no rhonchi, wheezes, or rales. Abdomen: Soft, nontender, nondistended, with no rigidity or guarding. Extremities: No cyanosis or edema. Lymphatics: see Neck Exam Skin: No concerning lesions. Musculoskeletal: symmetric strength and muscle tone throughout. Neurologic: Cranial nerves II through XII are grossly intact. No obvious focalities. Speech is fluent. Coordination is intact. Psychiatric: Judgment and insight are intact. Affect is appropriate.   ECOG = 0  0 - Asymptomatic (Fully active, able to carry on all predisease activities without restriction)  1 - Symptomatic but completely ambulatory (Restricted in physically strenuous activity but ambulatory and able to carry out work of a light or sedentary nature. For example, light housework, office work)  2 - Symptomatic, <50% in bed during the day (Ambulatory and capable of all self care but unable to carry out any work activities. Up and about more than 50% of waking hours)  3 - Symptomatic, >50% in bed, but not bedbound (Capable of only limited self-care, confined to bed or chair 50% or more of waking hours)  4 - Bedbound (Completely disabled. Cannot carry on any self-care. Totally confined to bed or chair)  5 - Death   Eustace Pen MM, Creech RH, Tormey DC, et al. [phone]). "Toxicity and response criteria of the [facility]". [place]col. 5 (6): 649-55   LABORATORY DATA:  Lab Results  Component Value Date   WBC 4.6 01/19/2011   HGB 7.9* 01/19/2011   HCT 26.3* 01/19/2011   MCV 89.5 01/19/2011   PLT 183 01/19/2011   CMP     Component Value Date/Time   NA 138 01/19/2011 0315   K 3.6 01/19/2011 0315   CL 104 01/19/2011 0315   CO2 26 01/19/2011 0315   GLUCOSE 97 01/19/2011 0315   BUN 4* 01/19/2011 0315   CREATININE 0.55 01/19/2011 0315   CALCIUM 9.2 01/19/2011 0315   PROT 6.7 01/19/2011 0315   ALBUMIN 2.6* 01/19/2011 0315   AST 32  01/19/2011 0315   ALT 22 01/19/2011 0315   ALKPHOS 65 01/19/2011 0315   BILITOT 0.2* 01/19/2011 0315   GFRNONAA >90 01/19/2011 0315   GFRAA >90 01/19/2011 0315         RADIOGRAPHY: No results found.    IMPRESSION/PLAN:  This is a delightful patient with head and neck cancer. I recommend he be considering for TORS surgery.  He believe a referral was made to [facility] by the med/onc physician who saw him, Dr Vashti Hey. I called Dr. Nicolette Bang of [facility] to tell him about this patient. I'll make a referral too to expedite matters.  We discussed the potential risks, benefits, and side effects of radiotherapy (which is an alternative to TORS surgery and sometimes needed after TORS). We talked in detail about acute and late effects. We discussed that some of the most bothersome acute effects may be mucositis,  dysgeusia, salivary changes, skin irritation, hair loss, dehydration, weight loss and fatigue. We talked about late effects which include but are not necessarily limited to dysphagia, hypothyroidism, nerve injury, spinal cord injury, xerostomia, trismus, and neck edema. No guarantees of treatment were given. A consent form was signed and placed in the patient's medical record in case it's needed  He needs staging scans.  At minimum, he needs CT of neck and chest with contrast.  He may undergo a PET as well.  Imaging scheduled for 06-19-15 at [facility]   We also discussed that the treatment of head and neck cancer is a multidisciplinary process to maximize treatment outcomes and quality of life. I will refer him to other departments, such as dentistry and SLP, if warranted in the setting of radiotherapy.  He will cancel his PEG placement for tomorrow in case he pursues TORS and doesn't require RT.  He has my contact info and knows to call if he chooses to pursue RT. I will not schedule anything at this time.  As stated above, I will refer him to Dr Nicolette Bang of [facility] in case Dr Jamelle Haring referral  hasn't gone through yet.  Applauded him on tobacco cessation and sobriety.  __________________________________________   Eppie Gibson, MD

## 2015-06-14 ENCOUNTER — Ambulatory Visit
Admission: RE | Admit: 2015-06-14 | Discharge: 2015-06-14 | Disposition: A | Discharge status: Home / Self Care | Payer: No Typology Code available for payment source | Source: Ambulatory Visit | Attending: Radiation Oncology | Admitting: Radiation Oncology

## 2015-06-14 ENCOUNTER — Encounter: Payer: Self-pay | Admitting: Radiation Oncology

## 2015-06-14 ENCOUNTER — Other Ambulatory Visit: Payer: Self-pay | Admitting: Radiation Oncology

## 2015-06-14 ENCOUNTER — Encounter: Payer: Self-pay | Admitting: *Deleted

## 2015-06-14 VITALS — BP 136/100 | HR 81 | Temp 97.8°F | Wt 209.3 lb

## 2015-06-14 DIAGNOSIS — Z87891 Personal history of nicotine dependence: Secondary | ICD-10-CM | POA: Diagnosis not present

## 2015-06-14 DIAGNOSIS — C051 Malignant neoplasm of soft palate: Secondary | ICD-10-CM | POA: Diagnosis not present

## 2015-06-14 DIAGNOSIS — Z87898 Personal history of other specified conditions: Secondary | ICD-10-CM | POA: Insufficient documentation

## 2015-06-14 HISTORY — DX: Unspecified injury of muscle(s) and tendon(s) of the rotator cuff of unspecified shoulder, initial encounter: S46.009A

## 2015-06-14 HISTORY — DX: Unilateral primary osteoarthritis, unspecified knee: M17.10

## 2015-06-14 HISTORY — DX: Osteoarthritis of knee, unspecified: M17.9

## 2015-06-15 ENCOUNTER — Telehealth: Payer: Self-pay | Admitting: *Deleted

## 2015-06-15 NOTE — Telephone Encounter (Signed)
Called patient to ask question, lvm for a return call 

## 2015-06-15 NOTE — Addendum Note (Signed)
Encounter addended by: Ernst Spell, RN on: 06/15/2015  7:35 AM<BR>     Documentation filed: Charges VN

## 2015-06-17 NOTE — Progress Notes (Signed)
  Oncology Nurse Navigator Documentation  Navigator Location: [facility] On[facility] (06/14/15 5834) Navigator Encounter Type: Initial RadOnc (06/14/15 0850)           Patient Visit Type: MITVIF (06/14/15 0850)                    Acuity: Level 2 (06/14/15 0850)   Acuity Level 2: Assistance expediting appointments;Initial guidance, education and coordination as needed (06/14/15 0850)     Met with patient during initial consult with Dr. Isidore Moos.  He was accompanied by his SO, dtr and grand-dtr.   1. Further introduced myself as his Navigator, explained my role as a member of the Care Team.   2. Provided New Patient Information packet, discussed contents:  Contact information for physician(s), myself, other members of the Care Team.  Advance Directive information ([place] blue pamphlet with LCSW contact info)  Fall Prevention Patient [place] sheet  [place] campus map with highlight of [place] 3. Provided introductory explanation of radiation treatment including SIM planning and purpose of Aquaplast head and shoulder mask, showed them example.   4. He provided contact information for his [place]:  Joline Maxcy, [phone] ext [phone]; name of dietician, Rowe Robert. 5. He expressed interest in referral to [facility] for discussion of surgical tmt otption.  Dr. Isidore Moos to place referral. 6. Patient indicated:  Has PET scheduled for 3/6.  Has PEG placement scheduled for tomorrow.  He is going to cancel this appt pending outcome of [facility] referral, reschedule as necessary. 7. I encouraged him to contact me with questions/concerns and to provide updates re [facility] and [facility] providers as available.  He agreed.  They verbalized understanding of information provided.    Gayleen Orem, RN, BSN, [place] at [place] [phone]     Time Spent with Patient: 120  (06/14/15 0850)

## 2015-06-29 ENCOUNTER — Telehealth: Payer: Self-pay | Admitting: *Deleted

## 2015-06-29 NOTE — Telephone Encounter (Signed)
  Oncology Nurse Navigator Documentation  Navigator Location: [facility] (06/29/15 1351) Navigator Encounter Type: Telephone (06/29/15 1351) Telephone: Outgoing Call;Patient Update (06/29/15 1351)      Called Mr. Burbano to check on the status of his referral to Dr[facility] Nico[facility].  He indicated he met with Dr. Nicolette Bang yesterday, that he has decided to move forward with surgery.  He agreed to call me when his surgery has been scheduled.  I updated Dr. Isidore Moos.  Gayleen Orem, RN, BSN, [place] at [place] [phone]                                         Time Spent with Patient: 15 (06/29/15 1351)

## 2015-07-11 ENCOUNTER — Encounter: Payer: Self-pay | Admitting: *Deleted

## 2015-07-11 NOTE — Progress Notes (Signed)
  Oncology Nurse Navigator Documentation  Navigator Location: [facility] (07/11/15 1551) Navigator Encounter Type: Telephone (07/11/15 1551) Telephone: Incoming Call;Patient Update (07/11/15 1551)     Surgery Date: 08/04/15 (07/11/15 1551)       Antonio Nicholson called to inform that his TORS with Dr. Nicolette Bang, [facility], is scheduled for 08/04/15.    I thanked him for his update, forwarded information to Dr. Isidore Moos.  Gayleen Orem, RN, BSN, [place] at [place] [phone]                                Time Spent with Patient: 15 (07/11/15 1551)

## 2015-08-04 IMAGING — CR DG WRIST COMPLETE 3+V*L*
4 series · 4 of 4 positions shown · non-contrast
Comparison: 08/10/2010

CLINICAL DATA: Initial encounter for fall 3 days ago with wrist
pain and swelling.

EXAM:
LEFT WRIST - COMPLETE 3+ VIEW

[x wrist pa left]
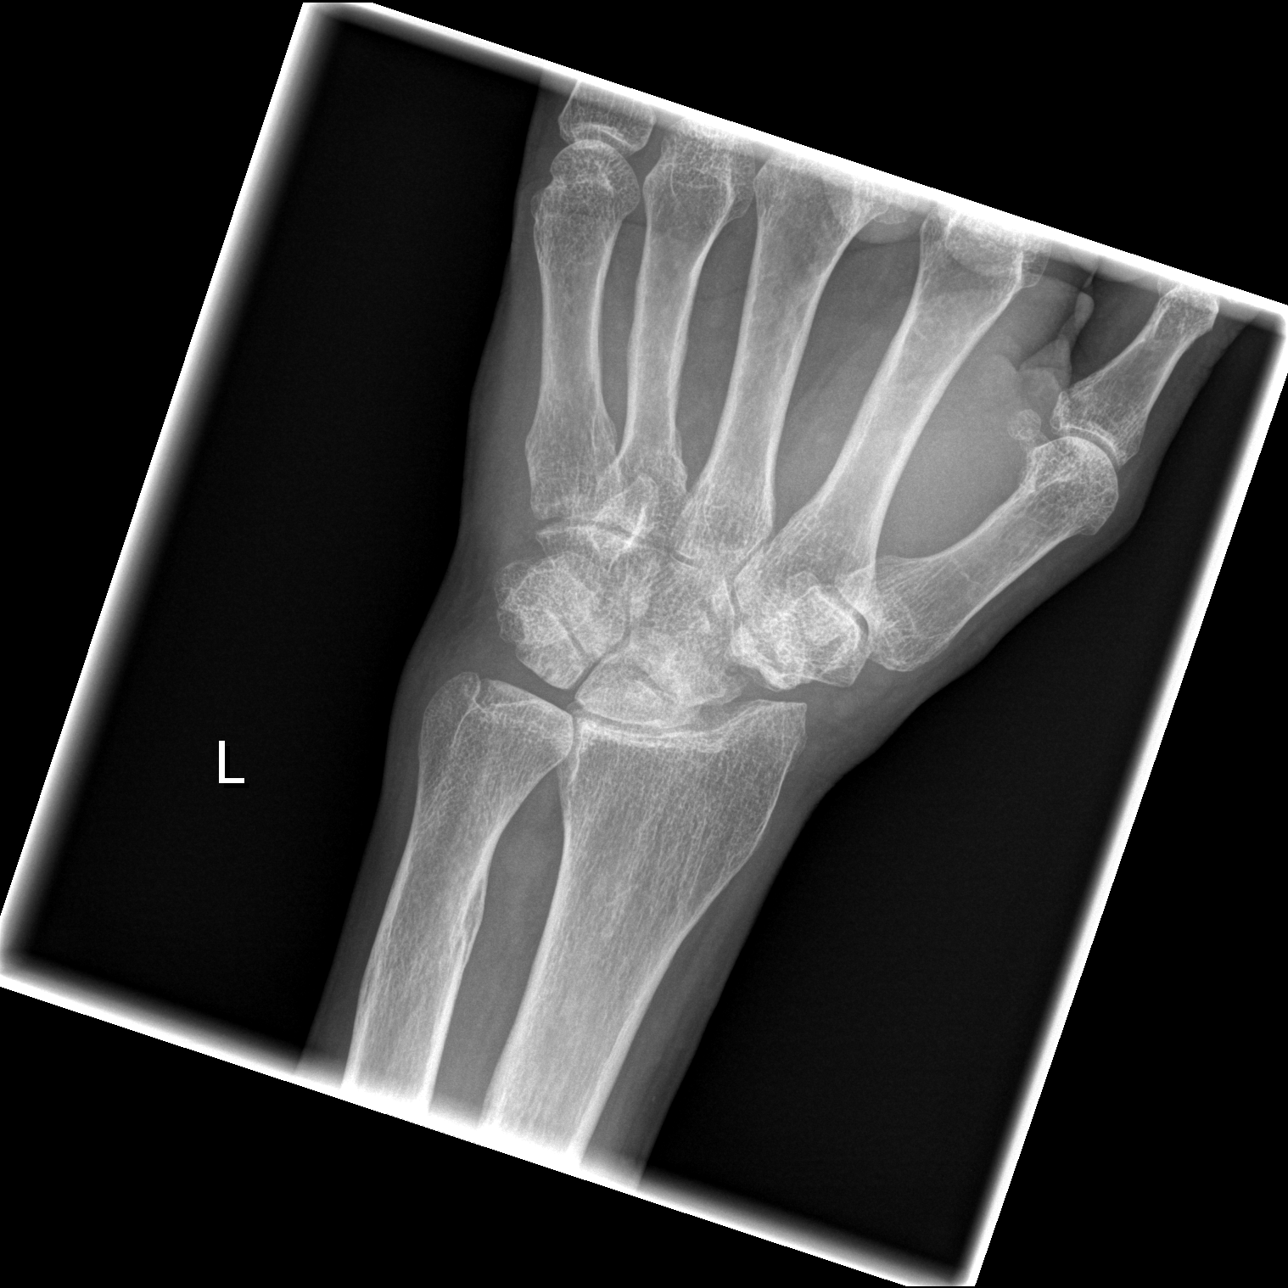

[x wrist obl left]
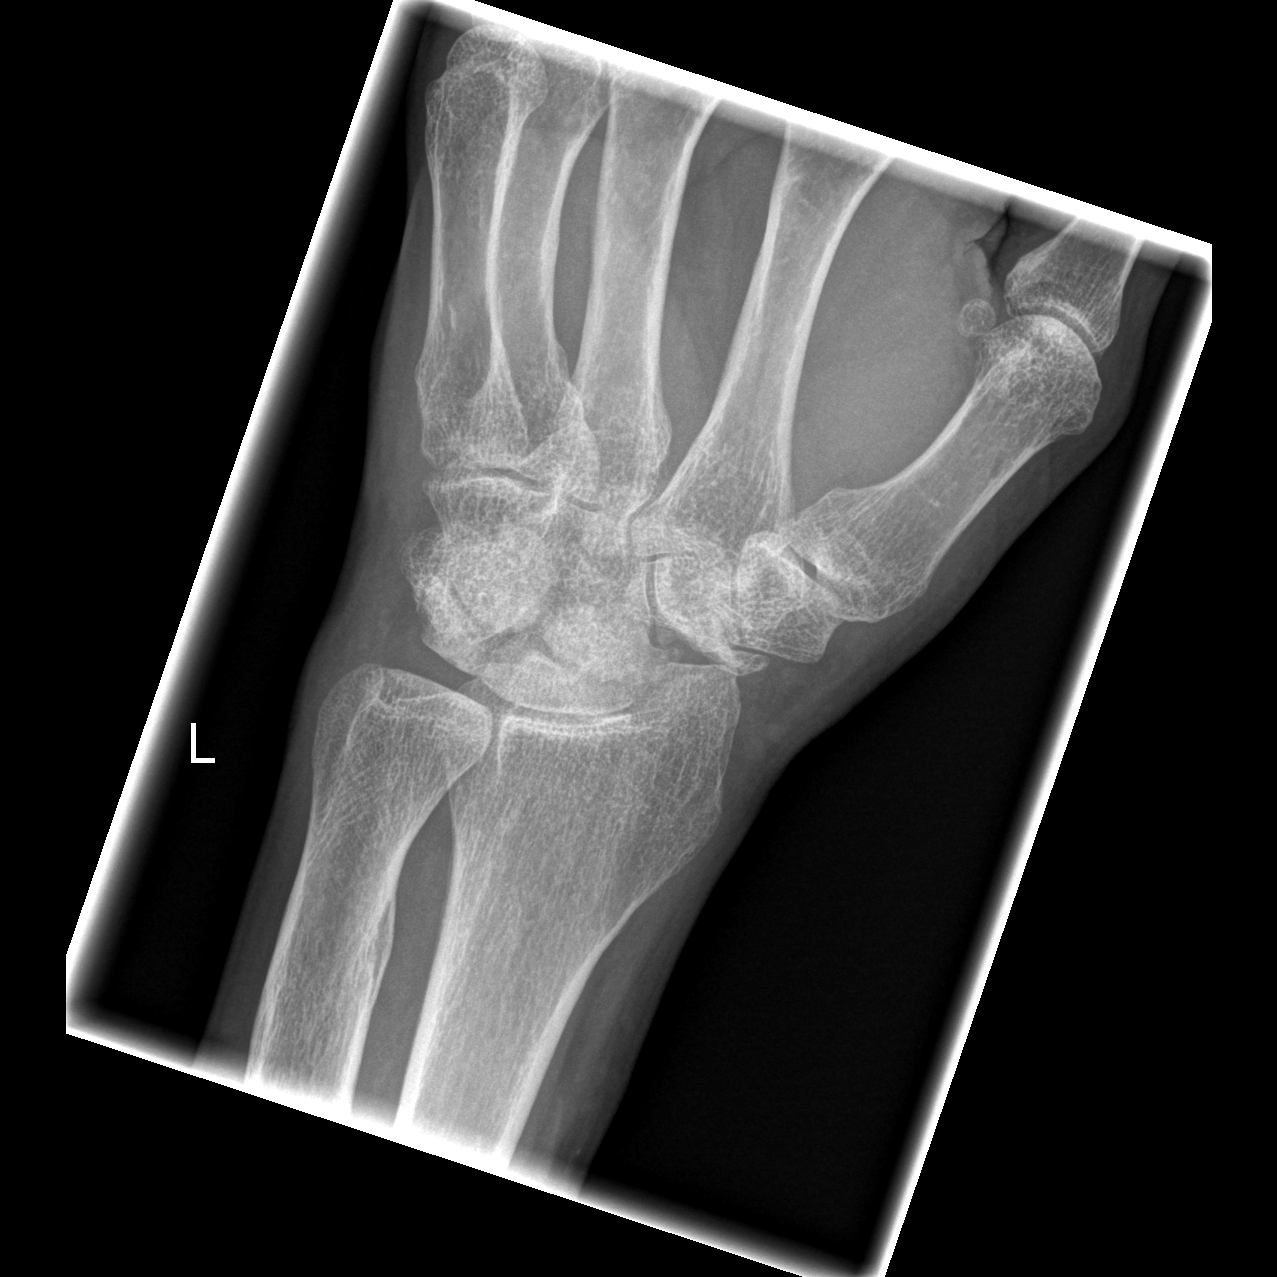

[x wrist lat left]
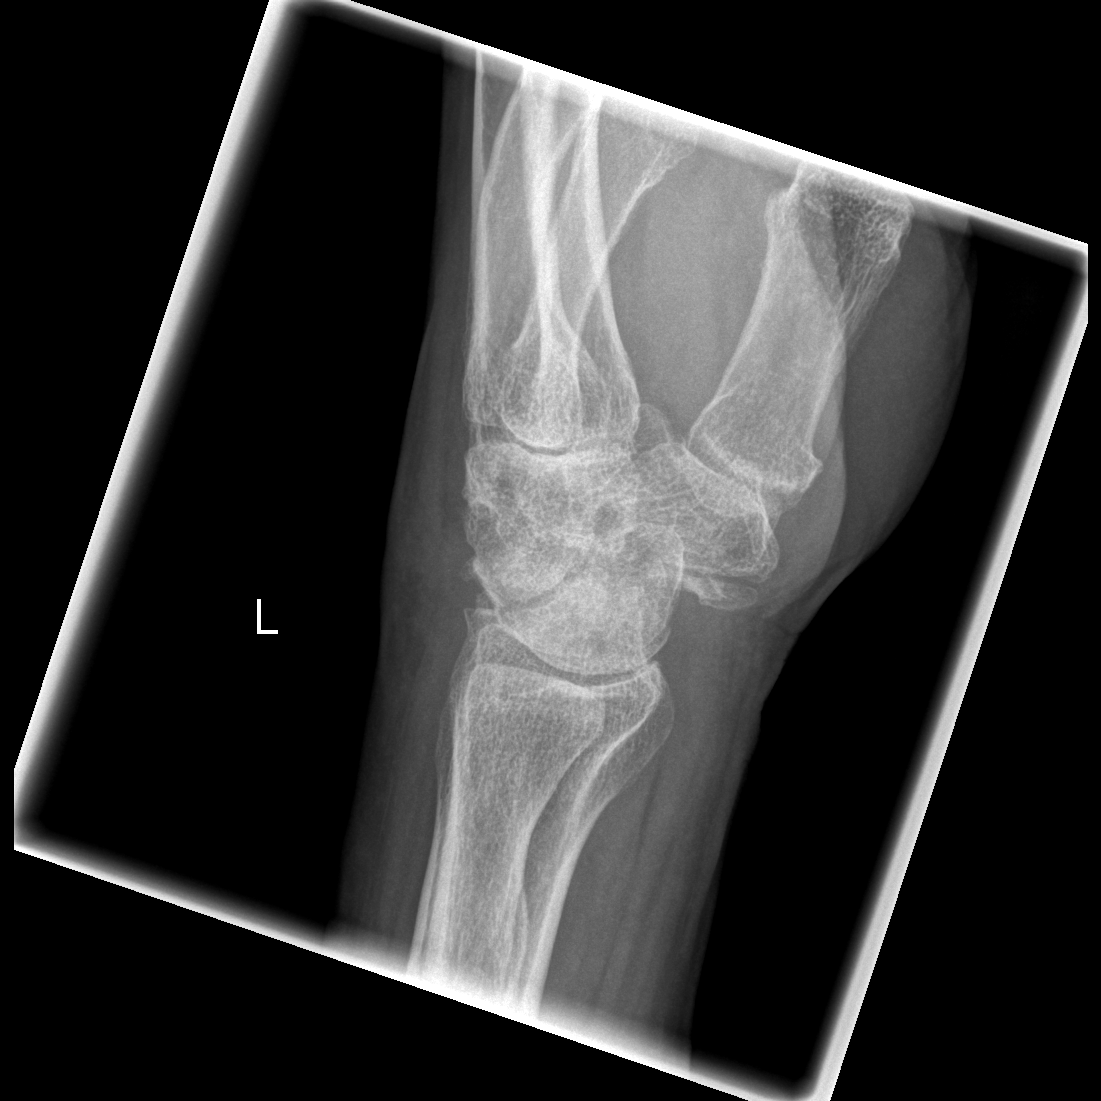

[x navicular]
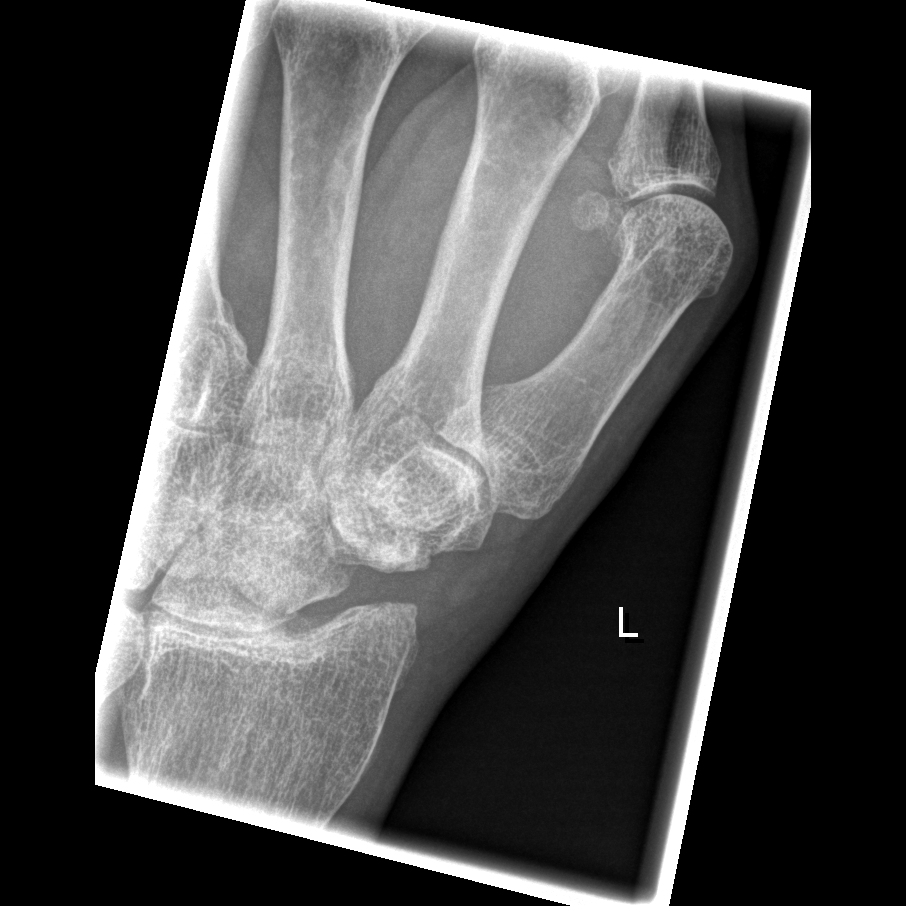

[4 of 4 positions shown; findings below may reference images not displayed]

FINDINGS: Four views study shows no fracture. No subluxation or dislocation.
Scaphoid is surgically absent. No worrisome lytic or sclerotic
osseous abnormality.
IMPRESSION: Stable.  No acute bony abnormality.

## 2015-08-07 ENCOUNTER — Telehealth: Payer: Self-pay | Admitting: *Deleted

## 2015-08-09 NOTE — Telephone Encounter (Signed)
  Oncology Nurse Navigator Documentation  Navigator Location: [facility] (08/07/15 0934) Navigator Encounter Type: Telephone (08/07/15 0934) Telephone: Incoming Call;Patient Update (08/07/15 0934) Abnormal Finding Date: 05/18/15 (08/07/15 0934) Confirmed Diagnosis Date: 06/19/15 (08/07/15 [id number]) Surgery Date: 08/04/15 (08/07/15 [id number])     Antonio Nicholson called to report that he underwent surgery on 4/21 as scheduled (Dr. Francina Ames, [facility]), is recovering well.  He is currently in the hospital, expects to be [place] in the next couple of days.  He has a post-surgical follow-up appt with Dr. Nicolette Bang on 08/17/15 1300.  I forwarded this update to Dr. Isidore Moos.  Gayleen Orem, RN, BSN, [place] at [place] [phone]                                 Time Spent with Patient: 15 (08/07/15 0934)

## 2015-09-01 ENCOUNTER — Telehealth: Payer: Self-pay | Admitting: *Deleted

## 2015-09-01 NOTE — Telephone Encounter (Signed)
A user error has taken place: encounter opened in error, closed for administrative reasons.

## 2015-09-01 NOTE — Telephone Encounter (Signed)
  Oncology Nurse Navigator Documentation  Navigator Location: [facility] (09/01/15 1405) Navigator Encounter Type: Telephone (09/01/15 1405) Telephone: [place] Call (09/01/15 1405)         Patient Visit Type: Follow-up (09/01/15 1405)           Called Mr. Raygoza to check on his well-being since his 4/21 surgery with Dr Nicolette Bang [facility].  LVMM asking him to return my call at his convenience.  Gayleen Orem, RN, BSN, [place] at [place] [phone]                       Time Spent with Patient: 15 (09/01/15 1405)

## 2016-01-18 ENCOUNTER — Encounter (HOSPITAL_COMMUNITY): Payer: Self-pay

## 2016-01-18 ENCOUNTER — Emergency Department (HOSPITAL_COMMUNITY)
Admission: EM | Admit: 2016-01-18 | Discharge: 2016-01-18 | Disposition: A | Discharge status: Home / Self Care | Payer: Medicare Other | Attending: Emergency Medicine | Admitting: Emergency Medicine

## 2016-01-18 DIAGNOSIS — I10 Essential (primary) hypertension: Secondary | ICD-10-CM | POA: Insufficient documentation

## 2016-01-18 DIAGNOSIS — Z85818 Personal history of malignant neoplasm of other sites of lip, oral cavity, and pharynx: Secondary | ICD-10-CM | POA: Insufficient documentation

## 2016-01-18 DIAGNOSIS — Z79899 Other long term (current) drug therapy: Secondary | ICD-10-CM | POA: Insufficient documentation

## 2016-01-18 DIAGNOSIS — F17210 Nicotine dependence, cigarettes, uncomplicated: Secondary | ICD-10-CM | POA: Insufficient documentation

## 2016-01-18 DIAGNOSIS — M109 Gout, unspecified: Secondary | ICD-10-CM | POA: Insufficient documentation

## 2016-01-18 MED ORDER — INDOMETHACIN 50 MG PO CAPS: 50.0000 mg | ORAL_CAPSULE | Freq: Three times a day (TID) | ORAL | 0 refills | Status: DC

## 2016-01-18 MED ORDER — PREDNISONE 20 MG PO TABS: 60.0000 mg | ORAL_TABLET | Freq: Every day | ORAL | 0 refills | Status: DC

## 2016-01-18 NOTE — ED Provider Notes (Signed)
[facility] DEPT Provider Note   CSN: [id number] Arrival date & time: 01/18/16  [id number]  By signing my name below, I, Evelene Croon, attest that this documentation has been prepared under the direction and in the presence of Noemi Chapel, MD . Electronically Signed: Evelene Croon, Scribe. 01/18/2016. 10:39 AM.  History   Chief Complaint Chief Complaint  Patient presents with  . Wrist Pain     The history is provided by the patient. No language interpreter was used.    HPI Comments:  Antonio Nicholson is a 65 y.o. male who presents to the Emergency Department complaining of moderate, atraumatic, right wrist pain since yesterday. He notes associated swelling to the site. Pt states he ate chicken that was fried in seafood grease and states he is not supposed to eat seafood as it often leads to a flare up of his gout. He notes his pain today is similar to past gout flare ups in the wrists. Pt is not currently on allopurinol;  states he was taken off by PCP. Pt is right hand dominant. He has taken at home pain meds without relief. He also notes in the past he has been given anti-inflammatories and prednisone for the same symptoms with moderate improvement. Pt admits to heavy alcohol consumption.   Past Medical History:  Diagnosis Date  . DJD (degenerative joint disease) of knee   . Hepatitis C   . Hypertension   . Rotator cuff injury     Patient Active Problem List   Diagnosis Date Noted  . Cancer of soft palate ([facility]) 06/14/2015    Past Surgical History:  Procedure Laterality Date  . APPENDECTOMY    . SCAPHOID FRACTURE SURGERY         Home Medications    Prior to Admission medications   Medication Sig Start Date End Date Taking? Authorizing Provider  allopurinol (ZYLOPRIM) 300 MG tablet Take 300 mg by mouth daily. Reported on 06/14/2015    Historical Provider, MD  buPROPion (WELLBUTRIN) 75 MG tablet Take 75 mg by mouth 2 (two) times daily.    Historical Provider, MD    ferrous fumarate (HEMOCYTE - 106 MG FE) 325 (106 FE) MG TABS tablet Take 1 tablet by mouth daily.    Historical Provider, MD  hydrochlorothiazide (HYDRODIURIL) 50 MG tablet Take 50 mg by mouth daily.    Historical Provider, MD  HYDROcodone-acetaminophen (NORCO/VICODIN) 5-325 MG per tablet Take 1-2 tablets by mouth every 6 (six) hours as needed. Patient not taking: Reported on 06/14/2015 01/21/14   Hyman Bible, PA-C  indomethacin (INDOCIN) 50 MG capsule Take 1 capsule (50 mg total) by mouth 3 (three) times daily with meals. 01/18/16   Noemi Chapel, MD  lidocaine (LMX) 4 % cream Apply 1 application topically 2 (two) times daily as needed (apply small amount to affected area twice a day).    Historical Provider, MD  Multiple Vitamins-Minerals (MULTIVITAMIN WITH MINERALS) tablet Take 1 tablet by mouth daily.    Historical Provider, MD  predniSONE (DELTASONE) 20 MG tablet Take 3 tablets (60 mg total) by mouth daily. 01/18/16   Noemi Chapel, MD  PRESCRIPTION MEDICATION Take 1 tablet by mouth 3 (three) times daily. For itching    Historical Provider, MD  sildenafil (VIAGRA) 100 MG tablet Take 100 mg by mouth daily as needed for erectile dysfunction (take 1 hour prior to sexual activity, , do not exceed 1 dose per 48 hour period).    Historical Provider, MD  sodium chloride (OCEAN) 0.65 %  SOLN nasal spray Place 2 sprays into both nostrils 3 (three) times daily.    Historical Provider, MD  timolol (TIMOPTIC) 0.5 % ophthalmic solution Place 1 drop into both eyes 2 (two) times daily.    Historical Provider, MD    Family History No family history on file.  Social History Social History  Substance Use Topics  . Smoking status: Current Every Day Smoker    Packs/day: 0.30    Types: Cigarettes  . Smokeless tobacco: Never Used  . Alcohol use Yes     Comment: occasionally     Allergies   Codeine   Review of Systems Review of Systems  Constitutional: Negative for fever.  Musculoskeletal: Positive  for arthralgias and joint swelling.       Right wrist     Physical Exam Updated Vital Signs BP (!) 138/107 (BP Location: Left Arm)   Pulse 93   Temp 98.6 F (37 C) (Oral)   Resp 18   SpO2 100%   Physical Exam  Constitutional: He appears well-developed and well-nourished.  HENT:  Head: Normocephalic and atraumatic.  Eyes: Conjunctivae are normal. Right eye exhibits no discharge. Left eye exhibits no discharge.  Cardiovascular:  Pulses:      Radial pulses are 2+ on the right side, and 2+ on the left side.  Pulmonary/Chest: Effort normal. No respiratory distress.  Musculoskeletal:  right wrist effusion with decreased ROM; no redness or warmth  nml flexion/extension fuction of fingers nml pulses   Neurological: He is alert. Coordination normal.  Skin: Skin is warm and dry. No rash noted. He is not diaphoretic. No erythema.  Psychiatric: He has a normal mood and affect.  Nursing note and vitals reviewed.     ED Treatments / Results  DIAGNOSTIC STUDIES:  Oxygen Saturation is 100% on RA, normal by my interpretation.    COORDINATION OF CARE:  10:24 AM Discussed treatment plan with pt at bedside and pt agreed to plan.  Labs (all labs ordered are listed, but only abnormal results are displayed) Labs Reviewed - No data to display  EKG  EKG Interpretation None       Radiology No results found.  Procedures Procedures (including critical care time)  Medications Ordered in ED Medications - No data to display   Initial Impression / Assessment and Plan / ED Course  I have reviewed the triage vital signs and the nursing notes.  Pertinent labs & imaging results that were available during my care of the patient were reviewed by me and considered in my medical decision making (see chart for details).  Clinical Course     Pt presents with monoarticular pain, swelling and erythema.  Pt is afebrile and stable.  Pt without known peptic ulcer disease and not receiving  concurrent treatment on warfarin. Will discharge with prednisone and and indomethacin. Had extended discussion about alcohol use (given hep C dx and need for NSAIDs for this illness) and signs of septic joint/ return precautions as well as s/s of a PUD - which could occur with all these meds / ETOH .   Pt expressed understanding  Final Clinical Impressions(s) / ED Diagnoses   Final diagnoses:  Acute gouty arthritis    New Prescriptions New Prescriptions   INDOMETHACIN (INDOCIN) 50 MG CAPSULE    Take 1 capsule (50 mg total) by mouth 3 (three) times daily with meals.   PREDNISONE (DELTASONE) 20 MG TABLET    Take 3 tablets (60 mg total) by mouth daily.  I personally performed the services described in this documentation, which was scribed in my presence. The recorded information has been reviewed and is accurate.       Noemi Chapel, MD 01/18/16 1048

## 2016-01-18 NOTE — Discharge Instructions (Signed)
Return for redness, fevers, increased pain or swelling Prednisone daily for 5 days Indomethacin 3 times daily as needed

## 2016-01-18 NOTE — ED Triage Notes (Signed)
Patient complains of right wrist pain x 1 day, states hx of gout and thinks the same. denies trauma

## 2016-02-16 ENCOUNTER — Emergency Department (HOSPITAL_COMMUNITY)
Admission: EM | Admit: 2016-02-16 | Discharge: 2016-02-16 | Disposition: A | Discharge status: Home / Self Care | Payer: Non-veteran care | Attending: Emergency Medicine | Admitting: Emergency Medicine

## 2016-02-16 ENCOUNTER — Encounter (HOSPITAL_COMMUNITY): Payer: Self-pay | Admitting: *Deleted

## 2016-02-16 DIAGNOSIS — I10 Essential (primary) hypertension: Secondary | ICD-10-CM | POA: Insufficient documentation

## 2016-02-16 DIAGNOSIS — M25532 Pain in left wrist: Secondary | ICD-10-CM | POA: Insufficient documentation

## 2016-02-16 DIAGNOSIS — F17210 Nicotine dependence, cigarettes, uncomplicated: Secondary | ICD-10-CM | POA: Insufficient documentation

## 2016-02-16 DIAGNOSIS — M25531 Pain in right wrist: Secondary | ICD-10-CM | POA: Diagnosis not present

## 2016-02-16 MED ORDER — PREDNISONE 20 MG PO TABS
40.0000 mg | ORAL_TABLET | Freq: Once | ORAL | Status: AC
  Administered 2016-02-16: 40 mg via ORAL
  Filled 2016-02-16: qty 2

## 2016-02-16 MED ORDER — INDOMETHACIN 25 MG PO CAPS
50.0000 mg | ORAL_CAPSULE | Freq: Once | ORAL | Status: AC
  Administered 2016-02-16: 50 mg via ORAL
  Filled 2016-02-16: qty 2

## 2016-02-16 MED ORDER — INDOMETHACIN 50 MG PO CAPS: 50.0000 mg | ORAL_CAPSULE | Freq: Three times a day (TID) | ORAL | 0 refills | Status: DC

## 2016-02-16 MED ORDER — PREDNISONE 10 MG (21) PO TBPK: 10.0000 mg | ORAL_TABLET | Freq: Every day | ORAL | 0 refills | Status: DC

## 2016-02-16 NOTE — ED Triage Notes (Signed)
The pt thinks he has gout again  He has pain and swelling in his lt wrist and both ankles since Tuesday  He has had gout in the past  [facility] did [facility] anything

## 2016-02-16 NOTE — ED Provider Notes (Signed)
[facility] DEPT Provider Note   CSN: [id number] Arrival date & time: 02/16/16  1809  By signing my name below, I, Antonio Nicholson, attest that this documentation has been prepared under the direction and in the presence of Antonio Evangelist, PA-C Electronically Signed: Soijett Nicholson, ED Scribe. 02/16/16. 8:08 PM.   History   Chief Complaint Chief Complaint  Patient presents with  . Wrist Pain    HPI  Antonio Nicholson is a 65 y.o. male with a PMHx of HTN, gout, who presents to the Emergency Department complaining of bilateral wrist pain onset 3 days. Pt notes that he thinks that his symptoms are similar to when he had gout flares. Pt denies having any medications for his gout flare. Pt states that the last time that he was seen in the ED he was prescribed indomethacin and prednisone with relief of his symptoms. Pt reports that he was placed on allopurinol and colchicine for his symptoms, to which he isn't taking at this time. Pt is having associated symptoms of bilateral wrist swelling and left ankle pain. He notes that he has not tried any medications for the relief of his symptoms. He denies color change, wound, rash, knee pain, fever, color change, rash, wound, and any other symptoms. Pt denies hx of DM at this time. Denies ETOH use (although has admitted to heavy use in the past).  Per pt chart review: Pt was seen in the ED on 01/18/2016 for left wrist pain. Pt was Rx indomethacin and prednisone for their symptoms.   The history is provided by the patient. No language interpreter was used.    Past Medical History:  Diagnosis Date  . DJD (degenerative joint disease) of knee   . Hepatitis C   . Hypertension   . Rotator cuff injury     Patient Active Problem List   Diagnosis Date Noted  . 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 APPENDECTOMY    . SCAPHOID FRACTURE SURGERY         Home Medications    Prior to  Admission medications   Medication Sig Start Date End Date Taking? Authorizing Provider  allopurinol (ZYLOPRIM) 300 MG tablet Take 300 mg by mouth daily. Reported on 06/14/2015    Historical Provider, MD  buPROPion (WELLBUTRIN) 75 MG tablet Take 75 mg by mouth 2 (two) times daily.    Historical Provider, MD  ferrous fumarate (HEMOCYTE - 106 MG FE) 325 (106 FE) MG TABS tablet Take 1 tablet by mouth daily.    Historical Provider, MD  hydrochlorothiazide (HYDRODIURIL) 50 MG tablet Take 50 mg by mouth daily.    Historical Provider, MD  HYDROcodone-acetaminophen (NORCO/VICODIN) 5-325 MG per tablet Take 1-2 tablets by mouth every 6 (six) hours as needed. Patient not taking: Reported on 06/14/2015 01/21/14   Hyman Bible, PA-C  indomethacin (INDOCIN) 50 MG capsule Take 1 capsule (50 mg total) by mouth 3 (three) times daily with meals. 01/18/16   Noemi Chapel, MD  lidocaine (LMX) 4 % cream Apply 1 application topically 2 (two) times daily as needed (apply small amount to affected area twice a day).    Historical Provider, MD  Multiple Vitamins-Minerals (MULTIVITAMIN WITH MINERALS) tablet Take 1 tablet by mouth daily.    Historical Provider, MD  predniSONE (DELTASONE) 20 MG tablet Take 3 tablets (60 mg total) by mouth daily. 01/18/16   Noemi Chapel, MD  PRESCRIPTION MEDICATION Take 1 tablet by mouth  3 (three) times daily. For itching    Historical Provider, MD  sildenafil (VIAGRA) 100 MG tablet Take 100 mg by mouth daily as needed for erectile dysfunction (take 1 hour prior to sexual activity, , do not exceed 1 dose per 48 hour period).    Historical Provider, MD  sodium chloride (OCEAN) 0.65 % SOLN nasal spray Place 2 sprays into both nostrils 3 (three) times daily.    Historical Provider, MD  timolol (TIMOPTIC) 0.5 % ophthalmic solution Place 1 drop into both eyes 2 (two) times daily.    Historical Provider, MD    Family History No family history on file.  Social History Social History  Substance Use  Topics  . Smoking status: Current Every Day Smoker    Packs/day: 0.30    Types: Cigarettes  . Smokeless tobacco: Never Used  . Alcohol use Yes     Comment: occasionally     Allergies   Codeine   Review of Systems Review of Systems  Constitutional: Negative for fever.  Musculoskeletal: Positive for arthralgias (bilateral wrist and left ankle) and joint swelling (bilateral wrist).  Skin: Negative for color change, rash and wound.     Physical Exam Updated Vital Signs BP (!) 142/104 (BP Location: Right Arm)   Pulse 118   Temp 98.1 F (36.7 C) (Oral)   Resp 16   SpO2 98%   Physical Exam  Constitutional: He is oriented to person, place, and time. He appears well-developed and well-nourished. No distress.  HENT:  Head: Normocephalic and atraumatic.  Eyes: EOM are normal.  Neck: Neck supple.  Cardiovascular: Normal rate.   Pulmonary/Chest: Effort normal. No respiratory distress.  Abdominal: He exhibits no distension.  Musculoskeletal: Normal range of motion.       Right wrist: He exhibits tenderness.       Left wrist: He exhibits tenderness.  Bilateral wrist erythema and swelling with significant TTP and warmth with palpation. ROM deferred due to pain. NVI.   Neurological: He is alert and oriented to person, place, and time.  Skin: Skin is warm and dry.  Psychiatric: He has a normal mood and affect. His behavior is normal.  Nursing note and vitals reviewed.    ED Treatments / Results  DIAGNOSTIC STUDIES: Oxygen Saturation is 98% on RA, nl by my interpretation.    COORDINATION OF CARE: 7:35 PM Discussed treatment plan with pt at bedside which includes prednisone and indomethacin and pt agreed to plan.   Procedures Procedures (including critical care time)  Medications Ordered in ED Medications  predniSONE (DELTASONE) tablet 40 mg (40 mg Oral Given 02/16/16 2013)  indomethacin (INDOCIN) capsule 50 mg (50 mg Oral Given 02/16/16 2014)     Initial Impression /  Assessment and Plan / ED Course  I have reviewed the triage vital signs and the nursing notes.   Clinical Course   Pt presents with polyarticular pain, swelling and erythema consistent with gout. Dose of steroids and NSAIDs given in ED. Will dc with indomethacin (50 mg PO TID) and prednisone Rx. Discussed that pt should respond to treatment with in 24 hour of begining treatment & likely resolve in 2-3 days. Follow up with PCP regarding when to start Allopurinol. Patient is NAD, non-toxic, with stable VS. Patient is informed of clinical course, understands medical decision making process, and agrees with plan. Opportunity for questions provided and all questions answered. Return precautions given.   Final Clinical Impressions(s) / ED Diagnoses   Final diagnoses:  Bilateral wrist pain  New Prescriptions New Prescriptions   No medications on file   I personally performed the services described in this documentation, which was scribed in my presence. The recorded information has been reviewed and is accurate.     Antonio Evangelist, PA-C 02/19/16 1025    Tanna Furry, MD 03/12/16 [phone]

## 2016-08-30 ENCOUNTER — Other Ambulatory Visit: Payer: Self-pay | Admitting: Internal Medicine

## 2016-08-30 ENCOUNTER — Ambulatory Visit
Admission: RE | Admit: 2016-08-30 | Discharge: 2016-08-30 | Disposition: A | Discharge status: Home / Self Care | Payer: No Typology Code available for payment source | Source: Ambulatory Visit | Attending: Internal Medicine | Admitting: Internal Medicine

## 2016-08-30 DIAGNOSIS — Z111 Encounter for screening for respiratory tuberculosis: Secondary | ICD-10-CM

## 2018-01-01 ENCOUNTER — Encounter (HOSPITAL_COMMUNITY): Payer: Self-pay

## 2018-01-01 ENCOUNTER — Ambulatory Visit (HOSPITAL_COMMUNITY)
Admission: EM | Admit: 2018-01-01 | Discharge: 2018-01-01 | Disposition: A | Discharge status: Home / Self Care | Payer: Self-pay | Attending: Family Medicine | Admitting: Family Medicine

## 2018-01-01 ENCOUNTER — Other Ambulatory Visit: Payer: Self-pay

## 2018-01-01 DIAGNOSIS — M109 Gout, unspecified: Secondary | ICD-10-CM

## 2018-01-01 MED ORDER — RABIES VACCINE, PCEC IM SUSR
INTRAMUSCULAR | Status: AC
  Filled 2018-01-01: qty 1

## 2018-01-01 MED ORDER — METHYLPREDNISOLONE ACETATE 80 MG/ML IJ SUSP
INTRAMUSCULAR | Status: AC
  Filled 2018-01-01: qty 1

## 2018-01-01 MED ORDER — PREDNISONE 20 MG PO TABS: 40.0000 mg | ORAL_TABLET | Freq: Every day | ORAL | 0 refills | Status: AC

## 2018-01-01 MED ORDER — METHYLPREDNISOLONE ACETATE 80 MG/ML IJ SUSP
80.0000 mg | Freq: Once | INTRAMUSCULAR | Status: AC
  Administered 2018-01-01: 80 mg via INTRAMUSCULAR

## 2018-01-01 NOTE — ED Triage Notes (Signed)
Pt states he has the gout in his left elbow and wrist. X 2 days

## 2018-01-01 NOTE — Discharge Instructions (Signed)
Ice application.  5 days of prednisone.  May use tylenol for breakthrough pain.  May take aleve or ibuprofen once 5 days of prednisone has been completed.  Drink plenty of water.  If symptoms worsen, develop fevers, chills, increased pain, or do not improve in the next week to return to be seen or to follow up with your PCP.

## 2018-01-01 NOTE — ED Provider Notes (Signed)
[facility]    CSN: [id number] Arrival date & time: 01/01/18  0909     History   Chief Complaint Chief Complaint  Patient presents with  . Gout    HPI Antonio Nicholson is a 67 y.o. male.   Antonio Nicholson presents with complaints of gout flair to left wrist and elbow which developed two days ago and is worsening. Has had in the past, per chart review to the wrists before, patient states has had it to almost all joints at some point. He states he did eat seafood prior to onset which does tend to trigger flair. No fevers. No injury. Swelling and pain. No numbness or tingling. Pain 10/10. Has not taken any medications for symptoms. Does take maintenance allopurinol. Hx of hp c, htn, djd.     ROS per HPI.      Past Medical History:  Diagnosis Date  . DJD (degenerative joint disease) of knee   . Hepatitis C   . Hypertension   . Rotator cuff injury     Patient Active Problem List   Diagnosis Date Noted  . 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 APPENDECTOMY    . SCAPHOID FRACTURE SURGERY         Home Medications    Prior to Admission medications   Medication Sig Start Date End Date Taking? Authorizing Provider  allopurinol (ZYLOPRIM) 300 MG tablet Take 300 mg by mouth daily. Reported on 06/14/2015    [provider]  ferrous fumarate (HEMOCYTE - 106 MG FE) 325 (106 FE) MG TABS tablet Take 1 tablet by mouth daily.    [provider]  lidocaine (LMX) 4 % cream Apply 1 application topically 2 (two) times daily as needed (apply small amount to affected area twice a day).    [provider]  Multiple Vitamins-Minerals (MULTIVITAMIN WITH MINERALS) tablet Take 1 tablet by mouth daily.    [provider]  predniSONE (DELTASONE) 20 MG tablet Take 2 tablets (40 mg total) by mouth daily with breakfast for 5 days. 01/01/18 01/06/18  Zigmund Gottron, NP  PRESCRIPTION MEDICATION Take 1  tablet by mouth 3 (three) times daily. For itching    [provider]  sildenafil (VIAGRA) 100 MG tablet Take 100 mg by mouth daily as needed for erectile dysfunction (take 1 hour prior to sexual activity, , do not exceed 1 dose per 48 hour period).    [provider]  sodium chloride (OCEAN) 0.65 % SOLN nasal spray Place 2 sprays into both nostrils 3 (three) times daily.    [provider]  timolol (TIMOPTIC) 0.5 % ophthalmic solution Place 1 drop into both eyes 2 (two) times daily.    [provider]    Family History History reviewed. No pertinent family history.  Social History Social History   Tobacco Use  . Smoking status: Current Every Day Smoker    Packs/day: 0.30    Types: Cigarettes  . Smokeless tobacco: Never Used  Substance Use Topics  . Alcohol use: Yes    Comment: occasionally  . Drug use: No     Allergies   Codeine   Review of Systems Review of Systems   Physical Exam Triage Vital Signs ED Triage Vitals  Enc Vitals Group     BP 01/01/18 0929 (!) 143/103     Pulse Rate 01/01/18 0929 92     Resp 01/01/18 0929 20  Temp 01/01/18 0929 97.9 F (36.6 C)     Temp Source 01/01/18 0929 Oral     SpO2 01/01/18 0929 96 %     Weight 01/01/18 0940 221 lb (100.2 kg)     Height --      Head Circumference --      Peak Flow --      Pain Score --      Pain Loc --      Pain Edu? --      Excl. in [place]? --    No data found.  Updated Vital Signs BP (!) 143/103 (BP Location: Right Arm)   Pulse 92   Temp 97.9 F (36.6 C) (Oral)   Resp 20   Wt 221 lb (100.2 kg)   SpO2 96%   BMI 26.55 kg/m   Visual Acuity Right Eye Distance:   Left Eye Distance:   Bilateral Distance:    Right Eye Near:   Left Eye Near:    Bilateral Near:     Physical Exam  Constitutional: He is oriented to person, place, and time. He appears well-developed and well-nourished.  Cardiovascular: Normal rate and regular rhythm.  Pulmonary/Chest: Effort  normal and breath sounds normal.  Musculoskeletal:       Left elbow: He exhibits decreased range of motion and swelling. He exhibits no effusion, no deformity and no laceration. Tenderness found.       Left wrist: He exhibits decreased range of motion, tenderness, bony tenderness and swelling. He exhibits no effusion, no crepitus, no deformity and no laceration.  Left wrist and elbow with generalized redness, swelling, warmth and tenderness; ROM limited due to pain; strong radial pulse; cap refill < 2 seconds , gross sensation intact to left hand   Neurological: He is alert and oriented to person, place, and time.  Skin: Skin is warm and dry.     UC Treatments / Results  Labs (all labs ordered are listed, but only abnormal results are displayed) Labs Reviewed - No data to display  EKG None  Radiology No results found.  Procedures Procedures (including critical care time)  Medications Ordered in UC Medications  methylPREDNISolone acetate (DEPO-MEDROL) injection 80 mg (has no administration in time range)    Initial Impression / Assessment and Plan / UC Course  I have reviewed the triage vital signs and the nursing notes.  Pertinent labs & imaging results that were available during my care of the patient were reviewed by me and considered in my medical decision making (see chart for details).     History and physical remain consistent with gout. depomedrol provided in clinic today per patient request, 5 days of prednisone. Return precautions provided. Patient verbalized understanding and agreeable to plan.   Final Clinical Impressions(s) / UC Diagnoses   Final diagnoses:  Acute gout of left elbow, unspecified cause  Acute gout of left wrist, unspecified cause     Discharge Instructions     Ice application.  5 days of prednisone.  May use tylenol for breakthrough pain.  May take aleve or ibuprofen once 5 days of prednisone has been completed.  Drink plenty of water.    If symptoms worsen, develop fevers, chills, increased pain, or do not improve in the next week to return to be seen or to follow up with your PCP.     ED Prescriptions    Medication Sig Dispense Auth. Provider   predniSONE (DELTASONE) 20 MG tablet Take 2 tablets (40 mg total) by mouth  daily with breakfast for 5 days. 10 tablet Zigmund Gottron, NP     Controlled Substance Prescriptions [place] Controlled Substance Registry consulted? Not Applicable   Zigmund Gottron, NP 01/01/18 1011

## 2018-03-13 IMAGING — CR DG CHEST 1V
1 series · 1 of 1 positions shown · non-contrast
Comparison: 08/10/2010

CLINICAL DATA: Positive PPD, smoker

EXAM:
CHEST 1 VIEW

[w chest pa]
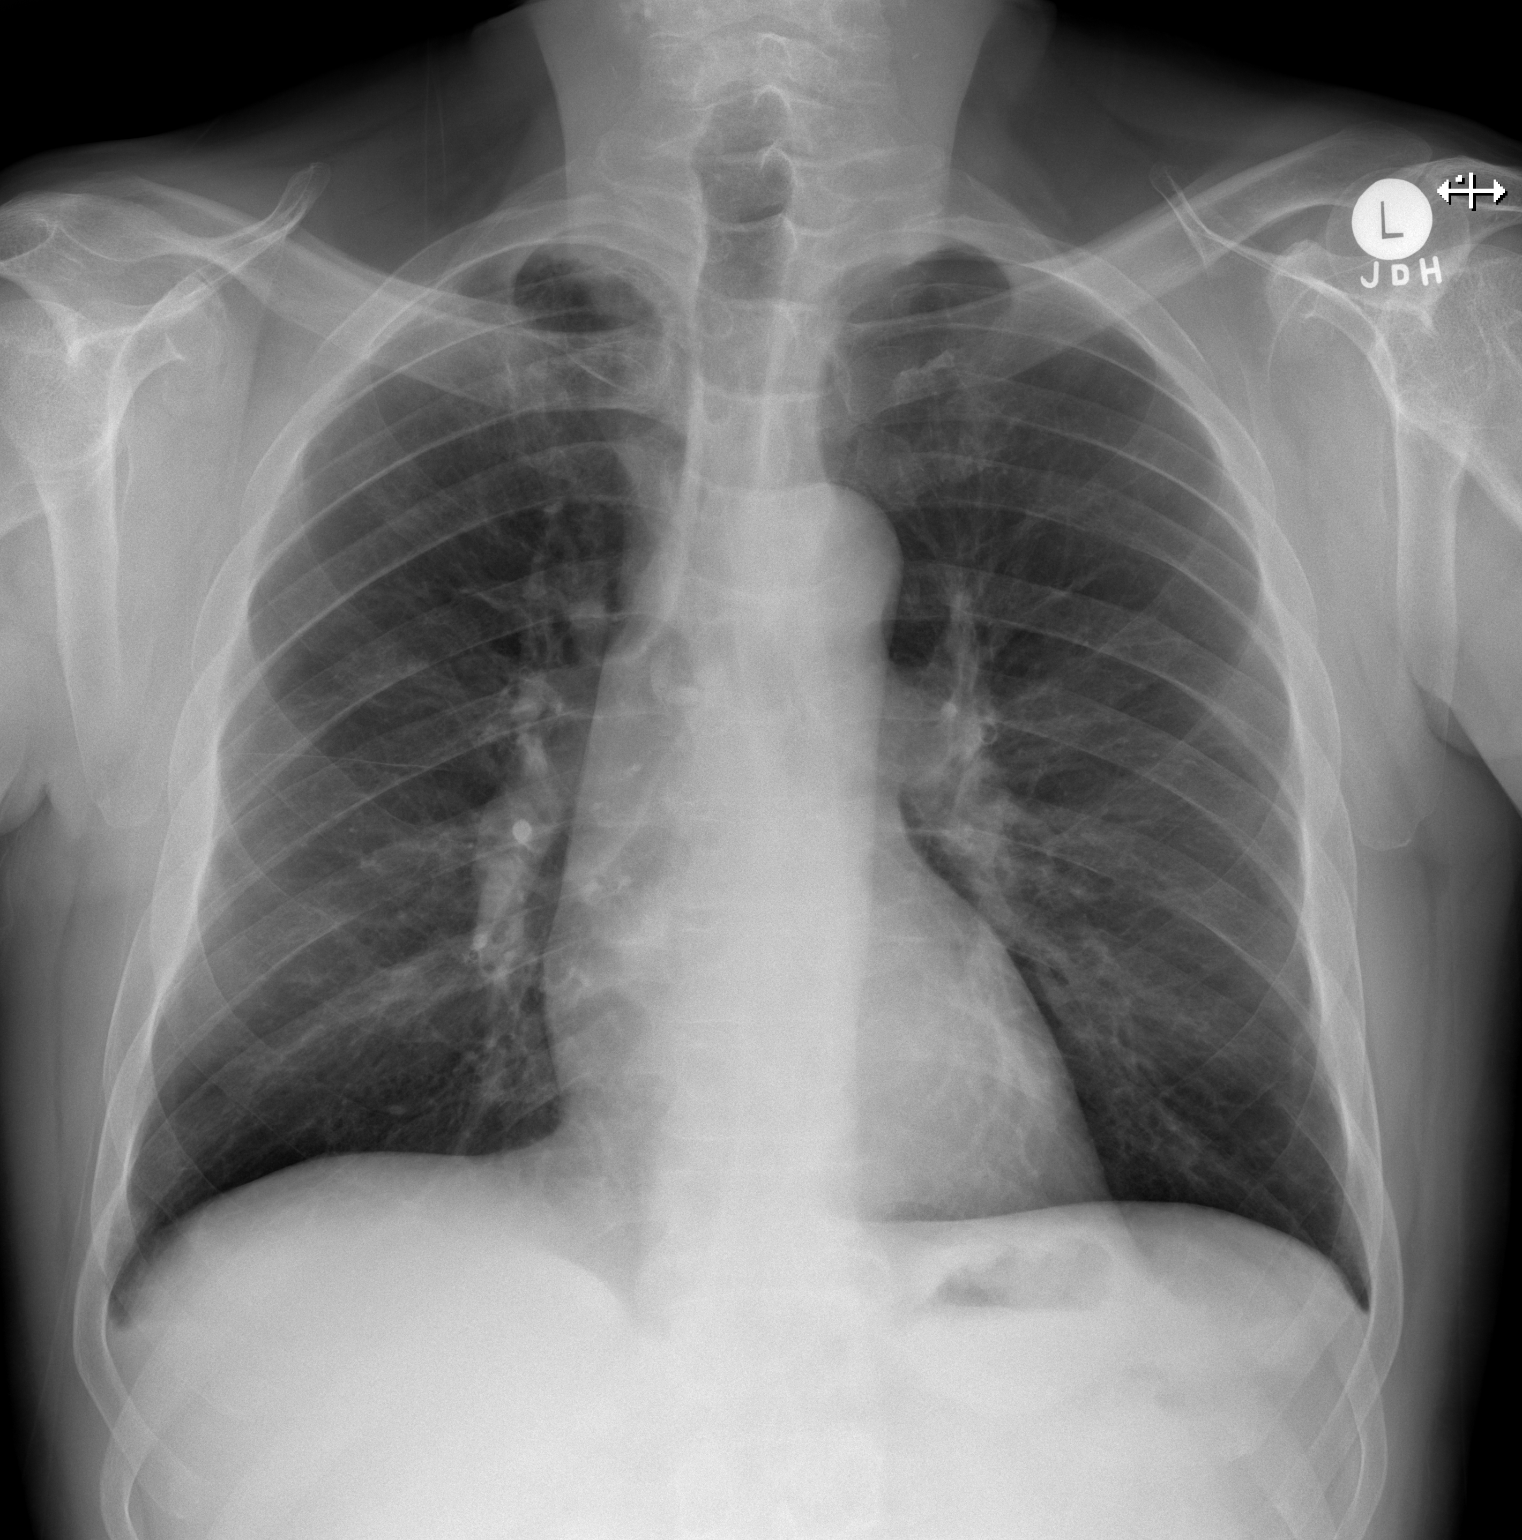

[1 of 1 positions shown; findings below may reference images not displayed]

FINDINGS: The heart size and mediastinal contours are within normal limits.
Both lungs are clear. The visualized skeletal structures are
unremarkable.
IMPRESSION: No active disease.

## 2020-02-12 ENCOUNTER — Ambulatory Visit: Payer: Self-pay | Attending: Internal Medicine

## 2020-02-12 DIAGNOSIS — Z23 Encounter for immunization: Secondary | ICD-10-CM

## 2020-02-12 NOTE — Progress Notes (Signed)
   Covid-19 Vaccination Clinic  Name:  ROLF FELLS    MRN: [id number] DOB: 29-Nov-1950  02/12/2020  Mr. Ellery was observed post Covid-19 immunization for 15 minutes without incident. He was provided with Vaccine Information Sheet and instruction to access the V-Safe system.   Mr. Murdaugh was instructed to call 911 with any severe reactions post vaccine: Marland Kitchen Difficulty breathing  . Swelling of face and throat  . A fast heartbeat  . A bad rash all over body  . Dizziness and weakness

## 2020-08-22 ENCOUNTER — Ambulatory Visit: Payer: Self-pay | Attending: Internal Medicine

## 2020-08-22 ENCOUNTER — Other Ambulatory Visit: Payer: Self-pay

## 2020-08-22 ENCOUNTER — Other Ambulatory Visit (HOSPITAL_BASED_OUTPATIENT_CLINIC_OR_DEPARTMENT_OTHER): Payer: Self-pay

## 2020-08-22 DIAGNOSIS — Z23 Encounter for immunization: Secondary | ICD-10-CM

## 2020-08-22 MED ORDER — PFIZER-BIONT COVID-19 VAC-TRIS 30 MCG/0.3ML IM SUSP
INTRAMUSCULAR | 0 refills | Status: AC
  Filled 2020-08-22: qty 0.3, 1d supply, fill #0

## 2020-08-22 NOTE — Progress Notes (Signed)
   Covid-19 Vaccination Clinic  Name:  Antonio Nicholson    MRN: [id number] DOB: 08-25-1950  08/22/2020  [name] was observed post Covid-19 immunization for 15 minutes without incident. He was provided with Vaccine Information Sheet and instruction to access the V-Safe system.   [name] was instructed to call 911 with any severe reactions post vaccine: Marland Kitchen Difficulty breathing  . Swelling of face and throat  . A fast heartbeat  . A bad rash all over body  . Dizziness and weakness   Immunizations Administered    Name Date Dose VIS Date Route   PFIZER Comrnaty(Gray TOP) Covid-19 Vaccine 08/22/2020  2:00 PM 0.3 mL 03/23/2020 Intramuscular   Manufacturer: [place]   Lot: [id number]   NDC: [phone]

## 2021-10-06 ENCOUNTER — Ambulatory Visit (HOSPITAL_COMMUNITY)
Admission: EM | Admit: 2021-10-06 | Discharge: 2021-10-06 | Disposition: A | Discharge status: Home / Self Care | Payer: Non-veteran care

## 2021-10-06 NOTE — ED Notes (Signed)
Per Onalee Hua NP, patient okay to go with his HR, notified patient to monitor his HR at home with the SPO2 sensor he has hanging around his neck on a necklace, and told him to notify his PCP if it remains elevated.

## 2022-10-14 ENCOUNTER — Encounter: Payer: Self-pay | Admitting: Skilled Nursing Facility1

## 2022-10-14 ENCOUNTER — Encounter
Payer: No Typology Code available for payment source | Attending: Internal Medicine | Admitting: Skilled Nursing Facility1

## 2022-10-14 DIAGNOSIS — R7303 Prediabetes: Secondary | ICD-10-CM | POA: Diagnosis present

## 2022-10-14 NOTE — Progress Notes (Signed)
Medical Nutrition Therapy    Primary concerns today: weight maintenance    Referral diagnosis: r73.3   NUTRITION ASSESSMENT    Clinical Medical Hx: Cancer, prediabetes, hepatitis C Medications: see list Labs: A1C 6.4 Notable Signs/Symptoms: shortness of breath from lung caner/current treatment   Lifestyle & Dietary Hx  Pt states he has trouble affording the glucern and [facility].  Pt states he is currently going through chemo for lung cancer.  Pt states he dropped from 238 to 216 pounds.  Pt states he really has to force himself to eat because the chemo has wrecked his appetite.  Pt states he really cannot eat so much at once stating he tries to eat throughout the day.  Pt states he was advised to be sure to drink lots and lots of water.  Pt states his girlfriend cooks for him and takes excellent care of him.   Estimated daily fluid intake: 100+ oz Supplements:  Sleep:  Stress / self-care: fairly low Current average weekly physical activity: aims for 4000 steps per day  24-Hr Dietary Recall: mints throughout the day First Meal: 2 pieces bread + sausage + egg sometimes with hashbrown or skipped Snack:  Second Meal: boiled hot dog dipped in ketchup or waffle + syrup and butter or vienna sausages Snack: peanut butter ritz crackers  Third Meal: shrimp + french fries and coleslaw Snack: nibbling on dinner eating until 11pm Beverages: 100-120 ounces water   NUTRITION INTERVENTION  Nutrition education (E-1) on the following topics:  Caloric value of food and how to choose those foods to mitigate any further weight loss   Learning Style & Readiness for Change Teaching method utilized: Visual & Auditory  Demonstrated degree of understanding via: Teach Back  Barriers to learning/adherence to lifestyle change: taste perception and appetite loss   Goals Established by Pt For now while going through therapy eat whatever you have a taste  for  If unable to eat a meal be sure to have a Glucerna Try peanut butter on your waffle rather than syrup Fat carries more calories than sugar so use oil or mayo or peanut butter where able instead of sugary stuff When off chemo and taste is back: avoid a lot of fried foods, gravy, and processed food to keep control of your A1C and reduce risks overall  Continue to use ketchup to help keep you eating You guys are really doing great! Let your girlfriend know if she has any questions to feel free to reach out   MONITORING & EVALUATION Dietary intake, weekly physical activity  Next Steps  Patient is to call or email with any future questions or concerns.

## 2023-02-13 ENCOUNTER — Emergency Department (HOSPITAL_COMMUNITY)
Admission: EM | Admit: 2023-02-13 | Discharge: 2023-02-13 | Disposition: A | Discharge status: Home / Self Care | Payer: No Typology Code available for payment source | Attending: Emergency Medicine | Admitting: Emergency Medicine

## 2023-02-13 ENCOUNTER — Emergency Department (HOSPITAL_COMMUNITY): Payer: No Typology Code available for payment source

## 2023-02-13 ENCOUNTER — Other Ambulatory Visit: Payer: Self-pay

## 2023-02-13 ENCOUNTER — Encounter (HOSPITAL_COMMUNITY): Payer: Self-pay

## 2023-02-13 DIAGNOSIS — Z85818 Personal history of malignant neoplasm of other sites of lip, oral cavity, and pharynx: Secondary | ICD-10-CM | POA: Insufficient documentation

## 2023-02-13 DIAGNOSIS — Z85118 Personal history of other malignant neoplasm of bronchus and lung: Secondary | ICD-10-CM | POA: Diagnosis not present

## 2023-02-13 DIAGNOSIS — I10 Essential (primary) hypertension: Secondary | ICD-10-CM | POA: Diagnosis not present

## 2023-02-13 DIAGNOSIS — R519 Headache, unspecified: Secondary | ICD-10-CM | POA: Diagnosis present

## 2023-02-13 DIAGNOSIS — G44209 Tension-type headache, unspecified, not intractable: Secondary | ICD-10-CM

## 2023-02-13 HISTORY — DX: Malignant (primary) neoplasm, unspecified: C80.1

## 2023-02-13 LAB — CBC WITH DIFFERENTIAL/PLATELET
Abs Immature Granulocytes: 0.03 10*3/uL (ref 0.00–0.07)
Basophils Absolute: 0.1 10*3/uL (ref 0.0–0.1)
Basophils Relative: 1 %
Eosinophils Absolute: 0 10*3/uL (ref 0.0–0.5)
Eosinophils Relative: 1 %
HCT: 27.9 % — ABNORMAL LOW (ref 39.0–52.0)
Hemoglobin: 8.7 g/dL — ABNORMAL LOW (ref 13.0–17.0)
Immature Granulocytes: 0 %
Lymphocytes Relative: 28 %
Lymphs Abs: 2 10*3/uL (ref 0.7–4.0)
MCH: 30.9 pg (ref 26.0–34.0)
MCHC: 31.2 g/dL (ref 30.0–36.0)
MCV: 98.9 fL (ref 80.0–100.0)
Monocytes Absolute: 1.1 10*3/uL — ABNORMAL HIGH (ref 0.1–1.0)
Monocytes Relative: 16 %
Neutro Abs: 4 10*3/uL (ref 1.7–7.7)
Neutrophils Relative %: 54 %
Platelets: 265 10*3/uL (ref 150–400)
RBC: 2.82 MIL/uL — ABNORMAL LOW (ref 4.22–5.81)
RDW: 15.5 % (ref 11.5–15.5)
WBC: 7.3 10*3/uL (ref 4.0–10.5)
nRBC: 0 % (ref 0.0–0.2)

## 2023-02-13 LAB — COMPREHENSIVE METABOLIC PANEL
ALT: 12 U/L (ref 0–44)
AST: 28 U/L (ref 15–41)
Albumin: 3.7 g/dL (ref 3.5–5.0)
Alkaline Phosphatase: 91 U/L (ref 38–126)
Anion gap: 10 (ref 5–15)
BUN: 21 mg/dL (ref 8–23)
CO2: 21 mmol/L — ABNORMAL LOW (ref 22–32)
Calcium: 9.4 mg/dL (ref 8.9–10.3)
Chloride: 107 mmol/L (ref 98–111)
Creatinine, Ser: 1.76 mg/dL — ABNORMAL HIGH (ref 0.61–1.24)
GFR, Estimated: 41 mL/min — ABNORMAL LOW (ref >60)
Glucose, Bld: 108 mg/dL — ABNORMAL HIGH (ref 70–99)
Potassium: 3.4 mmol/L — ABNORMAL LOW (ref 3.5–5.1)
Sodium: 138 mmol/L (ref 135–145)
Total Bilirubin: 0.3 mg/dL (ref 0.3–1.2)
Total Protein: 8.6 g/dL — ABNORMAL HIGH (ref 6.5–8.1)

## 2023-02-13 LAB — MAGNESIUM: Magnesium: 1.8 mg/dL (ref 1.7–2.4)

## 2023-02-13 MED ORDER — HEPARIN SOD (PORK) LOCK FLUSH 100 UNIT/ML IV SOLN
INTRAVENOUS | Status: AC
  Administered 2023-02-13: 500 [IU]
  Filled 2023-02-13: qty 5

## 2023-02-13 NOTE — ED Notes (Signed)
Pt. Denies blood draw, pt. Would like his port accessed instead.

## 2023-02-13 NOTE — ED Provider Notes (Addendum)
[place] EMERGENCY DEPARTMENT AT [facility] Provider Note   CSN: [id number] Arrival date & time: 02/13/23  1440     History  Chief Complaint  Patient presents with   Headache    Antonio Nicholson is a 72 y.o. male with history of hypertension, lung cancer, soft palate cancer, presents with concern for headaches.  States he has been having headaches more frequently for the past couple months.  He was referred here by the [facility] for a CT scan of the head to ensure there was no mass given his cancer history.  Patient denies any current headache.  Denies any changes in vision.  Headaches are not sudden onset.  Denies any fever or chills.   Headache      Home Medications Prior to Admission medications   Medication Sig Start Date End Date Taking? Authorizing Provider  allopurinol (ZYLOPRIM) 300 MG tablet Take 300 mg by mouth daily. Reported on 06/14/2015    [provider]  COVID-19 mRNA Vac-TriS, Pfizer, (PFIZER-BIONT COVID-19 VAC-TRIS) SUSP injection Inject into the muscle. 08/22/20   Judyann Munson, MD  ferrous fumarate (HEMOCYTE - 106 MG FE) 325 (106 FE) MG TABS tablet Take 1 tablet by mouth daily.    [provider]  lidocaine (LMX) 4 % cream Apply 1 application topically 2 (two) times daily as needed (apply small amount to affected area twice a day).    [provider]  Multiple Vitamins-Minerals (MULTIVITAMIN WITH MINERALS) tablet Take 1 tablet by mouth daily.    [provider]  PRESCRIPTION MEDICATION Take 1 tablet by mouth 3 (three) times daily. For itching    [provider]  sildenafil (VIAGRA) 100 MG tablet Take 100 mg by mouth daily as needed for erectile dysfunction (take 1 hour prior to sexual activity, , do not exceed 1 dose per 48 hour period).    [provider]  sodium chloride (OCEAN) 0.65 % SOLN nasal spray Place 2 sprays into both nostrils 3 (three) times daily.    [provider]  timolol  (TIMOPTIC) 0.5 % ophthalmic solution Place 1 drop into both eyes 2 (two) times daily.    [provider]      Allergies    Codeine and Other    Review of Systems   Review of Systems  Neurological:  Positive for headaches.    Physical Exam Updated Vital Signs BP (!) 148/87   Pulse 85   Temp 97.8 F (36.6 C)   Resp 17   Ht 6' 4.5" (1.943 m)   Wt 94.8 kg   SpO2 99%   BMI 25.11 kg/m  Physical Exam Vitals and nursing note reviewed.  Constitutional:      Appearance: Normal appearance.  HENT:     Head: Atraumatic.  Eyes:     Extraocular Movements: Extraocular movements intact.     Pupils: Pupils are equal, round, and reactive to light.  Neck:     Comments: No nuchal rigidity Cardiovascular:     Rate and Rhythm: Normal rate and regular rhythm.  Pulmonary:     Effort: Pulmonary effort is normal.     Comments: Lungs clear to auscultation bilaterally Musculoskeletal:     Comments: No spinous process tenderness palpation  Neurological:     General: No focal deficit present.     Mental Status: He is alert.  Psychiatric:        Mood and Affect: Mood normal.        Behavior: Behavior normal.  ED Results / Procedures / Treatments   Labs (all labs ordered are listed, but only abnormal results are displayed) Labs Reviewed  CBC WITH DIFFERENTIAL/PLATELET - Abnormal; Notable for the following components:      Result Value   RBC 2.82 (*)    Hemoglobin 8.7 (*)    HCT 27.9 (*)    Monocytes Absolute 1.1 (*)    All other components within normal limits  COMPREHENSIVE METABOLIC PANEL - Abnormal; Notable for the following components:   Potassium 3.4 (*)    CO2 21 (*)    Glucose, Bld 108 (*)    Creatinine, Ser 1.76 (*)    Total Protein 8.6 (*)    GFR, Estimated 41 (*)    All other components within normal limits  MAGNESIUM    EKG None  Radiology CT Head Wo Contrast  Result Date: 02/13/2023 CLINICAL DATA:  No evidence of acute intracranial abnormality.  EXAM: CT HEAD WITHOUT CONTRAST TECHNIQUE: Contiguous axial images were obtained from the base of the skull through the vertex without intravenous contrast. RADIATION DOSE REDUCTION: This exam was performed according to the departmental dose-optimization program which includes automated exposure control, adjustment of the mA and/or kV according to patient size and/or use of iterative reconstruction technique. COMPARISON:  CT head August 10, 2010. FINDINGS: Brain: No evidence of acute infarction, hemorrhage, hydrocephalus, extra-axial collection or mass lesion/mass effect. Patchy white matter hypodensities, nonspecific but compatible with chronic microvascular ischemic disease. Vascular: No hyperdense vessel. Skull: No acute fracture. Sinuses/Orbits: Clear sinuses.  No acute orbital findings. Other: No mastoid effusions. IMPRESSION: No evidence of acute intracranial abnormality. Electronically Signed   By: Feliberto Harts M.D.   On: 02/13/2023 17:00    Procedures Procedures    Medications Ordered in ED Medications  heparin lock flush 100 UNIT/ML injection (500 Units  Given 02/13/23 2032)    ED Course/ Medical Decision Making/ A&P Clinical Course as of 02/13/23 2034  Thu Feb 13, 2023  2021 WBC: 7.3 [AF]  2021 CBC with Differential(!) [AF]    Clinical Course User Index [AF] Arabella Merles, PA-C                                 Medical Decision Making Amount and/or Complexity of Data Reviewed Labs: ordered. Decision-making details documented in ED Course. Radiology: ordered.   Differential diagnosis includes but is not limited to intracranial mass, tension headache, migraines, electrolyte abnormality, meningitis  ED Course:  Patient overall well-appearing, no acute distress.  Vital signs within normal limits except for a slightly elevated blood pressure at 148/87.  He denies any current headache.  Denies any fever, chills, neck pain or stiffness, changes in vision.  Neurological exam  unremarkable.  CT head was obtained due to patient's history of cancer and concern regarding intracranial mass.  CT head was negative for any abnormalities.  Patient's CMP with an elevated creatinine at 1.76, however, patient's baseline is unknown.  The most recent creatinine was from 12 years ago.  CBC at patient baseline.  Magnesium within normal limits.  Patient stable for discharge home at this time.  Patient was provided with a copy of his lab and CT results.  Given a disc of his CT results.   Impression: Tension headaches  Disposition:  The patient was discharged home with instructions to follow-up with his PCP at the [facility] regarding his headaches and further management.  May take Tylenol as needed for  headaches.  Follow-up with PCP regarding his creatinine. Return precautions given.  Lab Tests: I Ordered, and personally interpreted labs.  The pertinent results include:   CBC at patient baseline CMP with creatinine 1.76, unknown patient's baseline Magnesium within normal limits  Imaging Studies ordered: I ordered imaging studies including CT head I independently visualized the imaging with scope of interpretation limited to determining acute life threatening conditions related to emergency care. Imaging showed no acute abnormalities I agree with the radiologist interpretation              Final Clinical Impression(s) / ED Diagnoses Final diagnoses:  Tension headache    Rx / DC Orders ED Discharge Orders     None         Arabella Merles, PA-C 02/13/23 2034    Arabella Merles, PA-C 02/13/23 2035    Bethann Berkshire, MD 02/17/23 1230

## 2023-02-13 NOTE — ED Provider Triage Note (Signed)
Emergency Medicine Provider Triage Evaluation Note  EMRE HUBBY , a 72 y.o. male  was evaluated in triage.  Pt presents with concern for new headaches ongoing for the past couple months.  He states the [place] sent him here to get an image of his head to rule out any mass given his history of lung cancer.  He denies any sudden onset of this headache, vision changes, fever or chills, neck stiffness.  Review of Systems  Positive: Above Negative: As above  Physical Exam  BP (!) 122/99   Pulse (!) 116   Temp 97.8 F (36.6 C) (Oral)   Resp 18   Ht 6' 4.5" (1.943 m)   Wt 94.8 kg   SpO2 100%   BMI 25.11 kg/m  Gen:   Awake, no distress   Resp:  Normal effort  MSK:   Moves extremities without difficulty    Medical Decision Making  Medically screening exam initiated at 3:28 PM.  Appropriate orders placed.  Doristine Devoid was informed that the remainder of the evaluation will be completed by another provider, this initial triage assessment does not replace that evaluation, and the importance of remaining in the ED until their evaluation is complete.     Arabella Merles, PA-C 02/13/23 1530

## 2023-02-13 NOTE — Discharge Instructions (Addendum)
Your workup today is reassuring.  Your CT head did not show any abnormalities.  Your labs appear to be at baseline.  You did have a slightly low potassium.  Please continue to eat potassium rich foods such as bananas, potatoes, avocados.  Your creatinine which is a measure of your kidney function was higher than the normal range today. Please continue to monitor this with your PCP.   You have been provided with a copy of the results from today.  Please follow-up with your provider at the [facility] for further care.  You may take up to 1000mg  of tylenol every 6 hours as needed for pain.  Do not take more then 4g per day.  Return the ER for any severe onset headache, changes in vision, fever, any other new or concerning symptoms.

## 2023-02-13 NOTE — ED Triage Notes (Signed)
Patient has stage 4 lung cancer, doing chemo. Has had a bad headache in the left frontal lobe for 1 week. Denies changes in vision, dizziness. Was told to come for scan of head to see if cancer spread.

## 2023-06-14 DEATH — deceased
